# Patient Record
Sex: Male | Born: 1940 | Race: White | Hispanic: No | Marital: Married | Smoking: Former smoker
Health system: Southern US, Community
[De-identification: ages and names within clinical notes are randomized; demographics above are authoritative.]

## PROBLEM LIST (undated history)

## (undated) DIAGNOSIS — I5022 Chronic systolic (congestive) heart failure: Secondary | ICD-10-CM

## (undated) DIAGNOSIS — I739 Peripheral vascular disease, unspecified: Secondary | ICD-10-CM

## (undated) DIAGNOSIS — E119 Type 2 diabetes mellitus without complications: Secondary | ICD-10-CM

## (undated) DIAGNOSIS — I10 Essential (primary) hypertension: Secondary | ICD-10-CM

## (undated) DIAGNOSIS — I2510 Atherosclerotic heart disease of native coronary artery without angina pectoris: Secondary | ICD-10-CM

## (undated) DIAGNOSIS — E785 Hyperlipidemia, unspecified: Secondary | ICD-10-CM

## (undated) DIAGNOSIS — E669 Obesity, unspecified: Secondary | ICD-10-CM

## (undated) HISTORY — DX: Peripheral vascular disease, unspecified: I73.9

## (undated) HISTORY — PX: KNEE SURGERY: SHX244

## (undated) HISTORY — DX: Obesity, unspecified: E66.9

## (undated) HISTORY — DX: Hyperlipidemia, unspecified: E78.5

## (undated) HISTORY — DX: Atherosclerotic heart disease of native coronary artery without angina pectoris: I25.10

## (undated) HISTORY — PX: CORONARY ANGIOPLASTY WITH STENT PLACEMENT: SHX49

---

## 2015-02-10 DIAGNOSIS — M109 Gout, unspecified: Secondary | ICD-10-CM | POA: Insufficient documentation

## 2015-02-10 DIAGNOSIS — M1990 Unspecified osteoarthritis, unspecified site: Secondary | ICD-10-CM | POA: Insufficient documentation

## 2015-02-10 DIAGNOSIS — N182 Chronic kidney disease, stage 2 (mild): Secondary | ICD-10-CM | POA: Insufficient documentation

## 2015-02-10 DIAGNOSIS — K219 Gastro-esophageal reflux disease without esophagitis: Secondary | ICD-10-CM | POA: Insufficient documentation

## 2015-02-10 DIAGNOSIS — J449 Chronic obstructive pulmonary disease, unspecified: Secondary | ICD-10-CM | POA: Insufficient documentation

## 2015-02-10 DIAGNOSIS — N184 Chronic kidney disease, stage 4 (severe): Secondary | ICD-10-CM | POA: Insufficient documentation

## 2016-04-11 DIAGNOSIS — M1711 Unilateral primary osteoarthritis, right knee: Secondary | ICD-10-CM | POA: Insufficient documentation

## 2019-07-04 ENCOUNTER — Telehealth: Payer: Self-pay | Admitting: Orthopaedic Surgery

## 2019-07-04 NOTE — Telephone Encounter (Signed)
Patient's son Oscar Kim) called advised his father's doctor is faxing a release form for Dr Ninfa Linden to complete and Oscar Kim said he will pick up the release form. The form will be coming from [place] and his doctor's name is Dr. Neldon Labella? The number to contact Oscar Kim is [phone] Note: Oscar Kim asked for a call back when release form is completed. Patient is scheduled to see Dr Ninfa Linden 08/19/2019.

## 2019-07-04 NOTE — Telephone Encounter (Signed)
Maybe for you?

## 2019-07-07 NOTE — Telephone Encounter (Signed)
IC, advised fax is here and at front desk to pick up

## 2019-07-23 ENCOUNTER — Other Ambulatory Visit: Payer: Self-pay

## 2019-07-23 ENCOUNTER — Ambulatory Visit (INDEPENDENT_AMBULATORY_CARE_PROVIDER_SITE_OTHER): Payer: Medicare Other | Admitting: Cardiology

## 2019-07-23 ENCOUNTER — Encounter: Payer: Self-pay | Admitting: Cardiology

## 2019-07-23 ENCOUNTER — Ambulatory Visit (INDEPENDENT_AMBULATORY_CARE_PROVIDER_SITE_OTHER): Payer: Medicare Other

## 2019-07-23 VITALS — BP 180/78 | HR 42 | Ht 65.0 in | Wt 212.0 lb

## 2019-07-23 DIAGNOSIS — I509 Heart failure, unspecified: Secondary | ICD-10-CM

## 2019-07-23 DIAGNOSIS — R0600 Dyspnea, unspecified: Secondary | ICD-10-CM

## 2019-07-23 DIAGNOSIS — I5022 Chronic systolic (congestive) heart failure: Secondary | ICD-10-CM

## 2019-07-23 DIAGNOSIS — I493 Ventricular premature depolarization: Secondary | ICD-10-CM

## 2019-07-23 DIAGNOSIS — I2510 Atherosclerotic heart disease of native coronary artery without angina pectoris: Secondary | ICD-10-CM

## 2019-07-23 DIAGNOSIS — E782 Mixed hyperlipidemia: Secondary | ICD-10-CM

## 2019-07-23 DIAGNOSIS — R5383 Other fatigue: Secondary | ICD-10-CM | POA: Insufficient documentation

## 2019-07-23 DIAGNOSIS — R0602 Shortness of breath: Secondary | ICD-10-CM | POA: Diagnosis not present

## 2019-07-23 DIAGNOSIS — I739 Peripheral vascular disease, unspecified: Secondary | ICD-10-CM | POA: Insufficient documentation

## 2019-07-23 DIAGNOSIS — I10 Essential (primary) hypertension: Secondary | ICD-10-CM

## 2019-07-23 HISTORY — DX: Dyspnea, unspecified: R06.00

## 2019-07-23 HISTORY — DX: Mixed hyperlipidemia: E78.2

## 2019-07-23 HISTORY — DX: Atherosclerotic heart disease of native coronary artery without angina pectoris: I25.10

## 2019-07-23 HISTORY — DX: Chronic systolic (congestive) heart failure: I50.22

## 2019-07-23 HISTORY — DX: Other fatigue: R53.83

## 2019-07-23 HISTORY — DX: Ventricular premature depolarization: I49.3

## 2019-07-23 MED ORDER — FUROSEMIDE 40 MG PO TABS: ORAL_TABLET | ORAL | 1 refills | Status: DC

## 2019-07-23 MED ORDER — METOPROLOL SUCCINATE ER 25 MG PO TB24: 12.5000 mg | ORAL_TABLET | Freq: Every day | ORAL | 1 refills | Status: DC

## 2019-07-23 NOTE — Progress Notes (Signed)
Cardiology Office Note:    Date:  07/23/2019   ID:  Oscar Kim, DOB 01-05-1941, MRN [id number]  PCP:  [name], NP  Cardiologist:  Berniece Salines, DO  Electrophysiologist:  None   Referring MD: [name], NP   Chief Complaint  Patient presents with  . Irregular Heart Beat    History of Present Illness:    Oscar Kim is a 79 y.o. 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 The patient tells me that for the last several months that he has been experiencing worsening shortness of breath. He notes that not only did he feel short of breath, he has been experiencing worsening fatigue and lightheadedness. He tells me that back in [place] he has an episode of syncope and has had multiple episodes of presyncope episodes.  Past Medical History:  Diagnosis Date  . CAD (coronary artery disease)   . DM (diabetes mellitus) ([place])   . Hyperlipidemia   . Hypertension   . Obesity (BMI 30-39.9)   . PAD (peripheral artery disease) ([facility])     Past Surgical History:  Procedure Laterality Date  . history of PCI      Current Medications: Current Meds  Medication Sig  . allopurinol (ZYLOPRIM) 100 MG tablet Take 100 mg by mouth 2 (two) times daily.  Marland Kitchen aspirin EC 81 MG tablet Take 81 mg by mouth daily.  . carvedilol (COREG) 3.125 MG tablet Take 3.125 mg by mouth 2 (two) times daily with a meal.  . clopidogrel (PLAVIX) 75 MG tablet Take 75 mg by mouth daily.  Marland Kitchen doxycycline (VIBRAMYCIN) 100 MG capsule Take 100 mg by mouth 2 (two) times daily.  . furosemide (LASIX) 40 MG tablet Take 60 mg (1.5 tablet) in the morning and 40 mg (1 tablet) in the evening daily.  Marland Kitchen gabapentin (NEURONTIN) 300 MG capsule Take 300 mg by mouth 3 (three) times daily.  . insulin NPH-regular Human (70-30) 100 UNIT/ML injection Inject into the skin.  Marland Kitchen  omeprazole (PRILOSEC) 40 MG capsule Take 40 mg by mouth daily.  . traMADol (ULTRAM) 50 MG tablet Take 50 mg by mouth in the morning, at noon, and at bedtime.  . [DISCONTINUED] furosemide (LASIX) 40 MG tablet Take 40 mg by mouth 2 (two) times daily.     Allergies:   Patient has no known allergies.   Social History   Socioeconomic History  . Marital status: Married    Spouse name: Not on file  . Number of children: Not on file  . Years of education: Not on file  . Highest education level: Not on file  Occupational History  . Not on file  Tobacco Use  . Smoking status: Former Smoker    Quit date: 07/23/1979    Years since quitting: 40.0  . Smokeless tobacco: Current User    Types: Snuff  Substance and Sexual Activity  . Alcohol use: Never  . Drug use: Never  . Sexual activity: Not on file  Other Topics Concern  . Not on file  Social History Narrative  . Not on file   Social Determinants of Health   Financial Resource Strain:   . Difficulty of Paying Living Expenses:   Food Insecurity:   . Worried About Charity fundraiser in the Last Year:   . [place] in the Last  Year:   Transportation Needs:   . Film/video editor (Medical):   Marland Kitchen Lack of Transportation (Non-Medical):   Physical Activity:   . Days of Exercise per Week:   . Minutes of Exercise per Session:   Stress:   . Feeling of Stress :   Social Connections:   . Frequency of Communication with Friends and Family:   . Frequency of Social Gatherings with Friends and Family:   . Attends Religious Services:   . Active Member of Clubs or Organizations:   . Attends Archivist Meetings:   Marland Kitchen Marital Status:      Family History: The patient's family history is not on file.  ROS:   Review of Systems  Constitution: Reports fatigue. Negative for decreased appetite, fever and weight gain.  HENT: Negative for congestion, ear discharge, hoarse voice and sore throat.   Eyes: Negative for discharge,  redness, vision loss in right eye and visual halos.  Cardiovascular: Report shortness of breath.  Negative for chest pain, leg swelling, orthopnea and palpitations.  Respiratory: Negative for cough, hemoptysis, shortness of breath and snoring.   Endocrine: Negative for heat intolerance and polyphagia.  Hematologic/Lymphatic: Negative for bleeding problem. Does not bruise/bleed easily.  Skin: Negative for flushing, nail changes, rash and suspicious lesions.  Musculoskeletal: Negative for arthritis, joint pain, muscle cramps, myalgias, neck pain and stiffness.  Gastrointestinal: Negative for abdominal pain, bowel incontinence, diarrhea and excessive appetite.  Genitourinary: Negative for decreased libido, genital sores and incomplete emptying.  Neurological: Negative for brief paralysis, focal weakness, headaches and loss of balance.  Psychiatric/Behavioral: Negative for altered mental status, depression and suicidal ideas.  Allergic/Immunologic: Negative for HIV exposure and persistent infections.    EKGs/Labs/Other Studies Reviewed:    The following studies were reviewed today:   EKG:  The ekg ordered today demonstrates Accelerated junctional rhythm with frequent PVC, HR 88 bpm.   Recent Labs: No results found for requested labs within last 8760 hours.  Recent Lipid Panel No results found for: CHOL, TRIG, HDL, CHOLHDL, VLDL, LDLCALC, LDLDIRECT  Physical Exam:    VS:  BP (!) 180/78 (BP Location: Right Arm, Patient Position: Sitting, Cuff Size: Normal)   Pulse (!) 42   Ht 5\' 5"  (1.651 m)   Wt 212 lb (96.2 kg)   SpO2 98%   BMI 35.28 kg/m     Wt Readings from Last 3 Encounters:  07/23/19 212 lb (96.2 kg)     GEN: obese. Well nourished, well developed in no acute distress HEENT: Normal NECK: No JVD; No carotid bruits LYMPHATICS: No lymphadenopathy CARDIAC: S1S2 noted,RRR, no murmurs, rubs, gallops RESPIRATORY:  Clear to auscultation without rales, wheezing or rhonchi    ABDOMEN: Soft, non-tender, non-distended, +bowel sounds, no guarding. EXTREMITIES: bilateral leg edema 2+, No cyanosis, no clubbing MUSCULOSKELETAL:  No deformity  SKIN: Warm and dry NEUROLOGIC:  Alert and oriented x 3, non-focal PSYCHIATRIC:  Normal affect, good insight  ASSESSMENT:    1. Frequent PVCs   2. Congestive heart failure, unspecified HF chronicity, unspecified heart failure type ([place])   3. Shortness of breath   4. Fatigue, unspecified type   5. Coronary artery disease involving native coronary artery of native heart without angina pectoris   6. PAD (peripheral artery disease) ([place])   7. Mixed hyperlipidemia   8. Essential hypertension    PLAN:     PVC- his ekg shows evidence of frequent pvc at this time- he is on coreg 3.125 mg BID we will continue  this for now.  Today I will place a 3 day zio monitor on him to understand the pvc burden.   Shortness of breath - could be multi factorial. I will change his Lasix dose, he will take 60 mg in the am and 40 mg in the pm. I will reevaluate the need for ischemic evaluation as this could also be an angina equivalent. The patient tells me that this prior cardiologist did some work up, I will get his records prior to pursue further ischemic work up. I will reach out to his prior cardiologist ( Dr. Rayna Sexton [phone]).   Will get blood work today which will include BMP, CBC, TSH, proBNP.  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 Lasix change as described above. Will also get a TTE as part of establishing his care.  CAD - continue DAPT with Aspirin and plavix - unclear when his last stent placement was. Will request records. He reports statin intolerance and does not want to take any statins. HE does have significant history of CAD, I will repeat his Lipid profile and Lpa - he may be a candidate for PCSK9 inhibitor.  DM - managed per pcp.  HTN - he is hypertensive today. I am hoping that the increase in the  lasix will help with this as well.   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 I will get I will repeat his Lipid profile and Lpa - he may be a candidate for PCSK9 inhibitor.  Obesity -the patient understands the need to lose weight with diet and exercise. We have discussed specific strategies for this.  The patient is in agreement with the above plan. The patient left the office in stable condition.  The patient will follow up in 1 month or sooner if needed.   Medication Adjustments/Labs and Tests Ordered: Current medicines are reviewed at length with the patient today.  Concerns regarding medicines are outlined above.  Orders Placed This Encounter  Procedures  . Basic metabolic panel  . Magnesium  . CBC  . TSH  . Pro b natriuretic peptide (BNP)  . LONG TERM MONITOR (3-14 DAYS)  . EKG 12-Lead  . ECHOCARDIOGRAM COMPLETE   Meds ordered this encounter  Medications  . DISCONTD: metoprolol succinate (TOPROL-XL) 25 MG 24 hr tablet    Sig: Take 0.5 tablets (12.5 mg total) by mouth daily.    Dispense:  45 tablet    Refill:  1  . furosemide (LASIX) 40 MG tablet    Sig: Take 60 mg (1.5 tablet) in the morning and 40 mg (1 tablet) in the evening daily.    Dispense:  225 tablet    Refill:  1    Patient Instructions  Medication Instructions:  Your physician has recommended you make the following change in your medication:   START: Metoprolol succinate 12.5 mg daily  INCREASE: Lasix to 60 mg in the morning and 40 mg in the pm daily   *If you need a refill on your cardiac medications before your next appointment, please call your pharmacy*   Lab Work: Your physician recommends that you return for lab work today: bmp, mg, cbc, tsh, bnp If you have labs (blood work) drawn today and your tests are completely normal, you will receive your results only by: Marland Kitchen MyChart Message (if you have MyChart) OR . A paper copy in the mail If you have any lab test that is abnormal or we need to  change your treatment, we will call you to review the results.   Testing/Procedures: Your physician has requested that you have an echocardiogram. Echocardiography is a painless test that uses sound waves to create images of your heart. It provides your doctor with information about the size and shape of your heart and how well your heart's chambers and valves are working. This procedure takes approximately one hour. There are no restrictions for this procedure.  A zio monitor was ordered today. It will remain on for 3 days. You will then return monitor and event diary in provided box. It takes 1-2 weeks for report to be downloaded and returned to [place]. We will call you with the results. If monitor falls off or has orange flashing light, please call Zio for further instructions.     Follow-Up: At [facility], you and your health needs are our priority.  As part of our continuing mission to provide you with exceptional heart care, we have created designated Provider Care Teams.  These Care Teams include your primary Cardiologist (physician) and Advanced Practice Providers (APPs -  Physician Assistants and Nurse Practitioners) who all work together to provide you with the care you need, when you need it.  We recommend signing up for the patient portal called "MyChart".  Sign up information is provided on this After Visit Summary.  MyChart is used to connect with patients for Virtual Visits (Telemedicine).  Patients are able to view lab/test results, encounter notes, upcoming appointments, etc.  Non-urgent messages can be sent to your provider as well.   To learn more about what you can do with MyChart, go to [email or web address].    Your next appointment:   1 month(s)  The format for your next appointment:   In Person  Provider:   Berniece Salines, DO   Other Instructions   Metoprolol Extended-Release Tablets What is this medicine? METOPROLOL (me TOE proe lole) is a beta blocker. It  decreases the amount of work your heart has to do and helps your heart beat regularly. It treats high blood pressure and/or prevent chest pain (also called angina). It also treats heart failure. This medicine may be used for other purposes; ask your health care provider or pharmacist if you have questions. COMMON BRAND NAME(S): toprol, Toprol XL What should I tell my health care provider before I take this medicine? They need to know if you have any of these conditions:  diabetes  heart or vessel disease like slow heart rate, worsening heart failure, heart block, sick sinus syndrome or Raynaud's disease  kidney disease  liver disease  lung or breathing disease, like asthma or emphysema  pheochromocytoma  thyroid disease  an unusual or allergic reaction to metoprolol, other beta-blockers, medicines, foods, dyes, or preservatives  pregnant or trying to get pregnant  breast-feeding How should I use this medicine? Take this drug by mouth. Take it as directed on the prescription label at the same time every day. Take it with food. You may cut the tablet in half if it is scored (has a line in the middle of it). This may help you swallow the tablet if the whole tablet is too big. Be sure to take both halves. Do not take just one-half of the tablet. Keep taking it unless your health care provider tells you to stop. Talk to your health care provider about the use of this drug in children. While it may be prescribed for children as young as 6 for selected conditions, precautions do  apply. Overdosage: If you think you have taken too much of this medicine contact a poison control center or emergency room at once. NOTE: This medicine is only for you. Do not share this medicine with others. What if I miss a dose? If you miss a dose, take it as soon as you can. If it is almost time for your next dose, take only that dose. Do not take double or extra doses. What may interact with this medicine? This  medicine may interact with the following medications:  certain medicines for blood pressure, heart disease, irregular heart beat  certain medicines for depression, like monoamine oxidase (MAO) inhibitors, fluoxetine, or paroxetine  clonidine  dobutamine  epinephrine  isoproterenol  reserpine This list may not describe all possible interactions. Give your health care provider a list of all the medicines, herbs, non-prescription drugs, or dietary supplements you use. Also tell them if you smoke, drink alcohol, or use illegal drugs. Some items may interact with your medicine. What should I watch for while using this medicine? Visit your doctor or health care professional for regular check ups. Contact your doctor right away if your symptoms worsen. Check your blood pressure and pulse rate regularly. Ask your health care professional what your blood pressure and pulse rate should be, and when you should contact them. You may get drowsy or dizzy. Do not drive, use machinery, or do anything that needs mental alertness until you know how this medicine affects you. Do not sit or stand up quickly, especially if you are an older patient. This reduces the risk of dizzy or fainting spells. Contact your doctor if these symptoms continue. Alcohol may interfere with the effect of this medicine. Avoid alcoholic drinks. This medicine may increase blood sugar. Ask your healthcare provider if changes in diet or medicines are needed if you have diabetes. What side effects may I notice from receiving this medicine? Side effects that you should report to your doctor or health care professional as soon as possible:  allergic reactions like skin rash, itching or hives  cold or numb hands or feet  depression  difficulty breathing  faint  fever with sore throat  irregular heartbeat, chest pain  rapid weight gain   signs and symptoms of high blood sugar such as being more thirsty or hungry or having to  urinate more than normal. You may also feel very tired or have blurry vision.  swollen legs or ankles Side effects that usually do not require medical attention (report to your doctor or health care professional if they continue or are bothersome):  anxiety or nervousness  change in sex drive or performance  dry skin  headache  nightmares or trouble sleeping  short term memory loss  stomach upset or diarrhea This list may not describe all possible side effects. Call your doctor for medical advice about side effects. You may report side effects to FDA at 1-800-FDA-1088. Where should I keep my medicine? Keep out of the reach of children and pets. Store at room temperature between 20 and 25 degrees C (68 and 77 degrees F). Throw away any unused drug after the expiration date. NOTE: This sheet is a summary. It may not cover all possible information. If you have questions about this medicine, talk to your doctor, pharmacist, or health care provider.  2020 Elsevier/Gold Standard (2018-12-05 18:23:00)   Echocardiogram An echocardiogram is a procedure that uses painless sound waves (ultrasound) to produce an image of the heart. Images from an echocardiogram  can provide important information about:  Signs of coronary artery disease (CAD).  Aneurysm detection. An aneurysm is a weak or damaged part of an artery wall that bulges out from the normal force of blood pumping through the body.  Heart size and shape. Changes in the size or shape of the heart can be associated with certain conditions, including heart failure, aneurysm, and CAD.  Heart muscle function.  Heart valve function.  Signs of a past heart attack.  Fluid buildup around the heart.  Thickening of the heart muscle.  A tumor or infectious growth around the heart valves. Tell a health care provider about:  Any allergies you have.  All medicines you are taking, including vitamins, herbs, eye drops, creams, and  over-the-counter medicines.  Any blood disorders you have.  Any surgeries you have had.  Any medical conditions you have.  Whether you are pregnant or may be pregnant. What are the risks? Generally, this is a safe procedure. However, problems may occur, including:  Allergic reaction to dye (contrast) that may be used during the procedure. What happens before the procedure? No specific preparation is needed. You may eat and drink normally. What happens during the procedure?   An IV tube may be inserted into one of your veins.  You may receive contrast through this tube. A contrast is an injection that improves the quality of the pictures from your heart.  A gel will be applied to your chest.  A wand-like tool (transducer) will be moved over your chest. The gel will help to transmit the sound waves from the transducer.  The sound waves will harmlessly bounce off of your heart to allow the heart images to be captured in real-time motion. The images will be recorded on a computer. The procedure may vary among health care providers and hospitals. What happens after the procedure?  You may return to your normal, everyday life, including diet, activities, and medicines, unless your health care provider tells you not to do that. Summary  An echocardiogram is a procedure that uses painless sound waves (ultrasound) to produce an image of the heart.  Images from an echocardiogram can provide important information about the size and shape of your heart, heart muscle function, heart valve function, and fluid buildup around your heart.  You do not need to do anything to prepare before this procedure. You may eat and drink normally.  After the echocardiogram is completed, you may return to your normal, everyday life, unless your health care provider tells you not to do that. This information is not intended to replace advice given to you by your health care provider. Make sure you discuss  any questions you have with your health care provider. Document Revised: 08/15/2018 Document Reviewed: 05/27/2016 Elsevier Patient Education  Box.     Adopting a Healthy Lifestyle.  Know what a healthy weight is for you (roughly BMI <25) and aim to maintain this   Aim for 7+ servings of fruits and vegetables daily   65-80+ fluid ounces of water or unsweet tea for healthy kidneys   Limit to max 1 drink of alcohol per day; avoid smoking/tobacco   Limit animal fats in diet for cholesterol and heart health - choose grass fed whenever available   Avoid highly processed foods, and foods high in saturated/trans fats   Aim for low stress - take time to unwind and care for your mental health   Aim for 150 min of moderate intensity exercise weekly  for heart health, and weights twice weekly for bone health   Aim for 7-9 hours of sleep daily   When it comes to diets, agreement about the perfect plan isnt easy to find, even among the experts. Experts at the [employer] developed an idea known as the Healthy Eating Plate. Just imagine a plate divided into logical, healthy portions.   The emphasis is on diet quality:   Load up on vegetables and fruits - one-half of your plate: Aim for color and variety, and remember that potatoes dont count.   Go for whole grains - one-quarter of your plate: Whole wheat, barley, wheat berries, quinoa, oats, brown rice, and foods made with them. If you want pasta, go with whole wheat pasta.   Protein power - one-quarter of your plate: Fish, chicken, beans, and nuts are all healthy, versatile protein sources. Limit red meat.   The diet, however, does go beyond the plate, offering a few other suggestions.   Use healthy plant oils, such as olive, canola, soy, corn, sunflower and peanut. Check the labels, and avoid partially hydrogenated oil, which have unhealthy trans fats.   If youre thirsty, drink water. Coffee and tea  are good in moderation, but skip sugary drinks and limit milk and dairy products to one or two daily servings.   The type of carbohydrate in the diet is more important than the amount. Some sources of carbohydrates, such as vegetables, fruits, whole grains, and beans-are healthier than others.   Finally, stay active  Signed, Berniece Salines, DO  07/23/2019 6:09 PM    [facility]

## 2019-07-23 NOTE — Patient Instructions (Addendum)
Medication Instructions:  Your physician has recommended you make the following change in your medication:   START: Metoprolol succinate 12.5 mg daily  INCREASE: Lasix to 60 mg in the morning and 40 mg in the pm daily   *If you need a refill on your cardiac medications before your next appointment, please call your pharmacy*   Lab Work: Your physician recommends that you return for lab work today: bmp, mg, cbc, tsh, bnp If you have labs (blood work) drawn today and your tests are completely normal, you will receive your results only by: Marland Kitchen MyChart Message (if you have MyChart) OR . A paper copy in the mail If you have any lab test that is abnormal or we need to change your treatment, we will call you to review the results.   Testing/Procedures: Your physician has requested that you have an echocardiogram. Echocardiography is a painless test that uses sound waves to create images of your heart. It provides your doctor with information about the size and shape of your heart and how well your heart's chambers and valves are working. This procedure takes approximately one hour. There are no restrictions for this procedure.  A zio monitor was ordered today. It will remain on for 3 days. You will then return monitor and event diary in provided box. It takes 1-2 weeks for report to be downloaded and returned to [place]. We will call you with the results. If monitor falls off or has orange flashing light, please call Zio for further instructions.     Follow-Up: At [facility], you and your health needs are our priority.  As part of our continuing mission to provide you with exceptional heart care, we have created designated Provider Care Teams.  These Care Teams include your primary Cardiologist (physician) and Advanced Practice Providers (APPs -  Physician Assistants and Nurse Practitioners) who all work together to provide you with the care you need, when you need it.  We recommend signing up for  the patient portal called "MyChart".  Sign up information is provided on this After Visit Summary.  MyChart is used to connect with patients for Virtual Visits (Telemedicine).  Patients are able to view lab/test results, encounter notes, upcoming appointments, etc.  Non-urgent messages can be sent to your provider as well.   To learn more about what you can do with MyChart, go to [email or web address].    Your next appointment:   1 month(s)  The format for your next appointment:   In Person  Provider:   Berniece Salines, DO   Other Instructions   Metoprolol Extended-Release Tablets What is this medicine? METOPROLOL (me TOE proe lole) is a beta blocker. It decreases the amount of work your heart has to do and helps your heart beat regularly. It treats high blood pressure and/or prevent chest pain (also called angina). It also treats heart failure. This medicine may be used for other purposes; ask your health care provider or pharmacist if you have questions. COMMON BRAND NAME(S): toprol, Toprol XL What should I tell my health care provider before I take this medicine? They need to know if you have any of these conditions:  diabetes  heart or vessel disease like slow heart rate, worsening heart failure, heart block, sick sinus syndrome or Raynaud's disease  kidney disease  liver disease  lung or breathing disease, like asthma or emphysema  pheochromocytoma  thyroid disease  an unusual or allergic reaction to metoprolol, other beta-blockers, medicines, foods, dyes, or preservatives  pregnant or trying to get pregnant  breast-feeding How should I use this medicine? Take this drug by mouth. Take it as directed on the prescription label at the same time every day. Take it with food. You may cut the tablet in half if it is scored (has a line in the middle of it). This may help you swallow the tablet if the whole tablet is too big. Be sure to take both halves. Do not take just  one-half of the tablet. Keep taking it unless your health care provider tells you to stop. Talk to your health care provider about the use of this drug in children. While it may be prescribed for children as young as 6 for selected conditions, precautions do apply. Overdosage: If you think you have taken too much of this medicine contact a poison control center or emergency room at once. NOTE: This medicine is only for you. Do not share this medicine with others. What if I miss a dose? If you miss a dose, take it as soon as you can. If it is almost time for your next dose, take only that dose. Do not take double or extra doses. What may interact with this medicine? This medicine may interact with the following medications:  certain medicines for blood pressure, heart disease, irregular heart beat  certain medicines for depression, like monoamine oxidase (MAO) inhibitors, fluoxetine, or paroxetine  clonidine  dobutamine  epinephrine  isoproterenol  reserpine This list may not describe all possible interactions. Give your health care provider a list of all the medicines, herbs, non-prescription drugs, or dietary supplements you use. Also tell them if you smoke, drink alcohol, or use illegal drugs. Some items may interact with your medicine. What should I watch for while using this medicine? Visit your doctor or health care professional for regular check ups. Contact your doctor right away if your symptoms worsen. Check your blood pressure and pulse rate regularly. Ask your health care professional what your blood pressure and pulse rate should be, and when you should contact them. You may get drowsy or dizzy. Do not drive, use machinery, or do anything that needs mental alertness until you know how this medicine affects you. Do not sit or stand up quickly, especially if you are an older patient. This reduces the risk of dizzy or fainting spells. Contact your doctor if these symptoms continue.  Alcohol may interfere with the effect of this medicine. Avoid alcoholic drinks. This medicine may increase blood sugar. Ask your healthcare provider if changes in diet or medicines are needed if you have diabetes. What side effects may I notice from receiving this medicine? Side effects that you should report to your doctor or health care professional as soon as possible:  allergic reactions like skin rash, itching or hives  cold or numb hands or feet  depression  difficulty breathing  faint  fever with sore throat  irregular heartbeat, chest pain  rapid weight gain   signs and symptoms of high blood sugar such as being more thirsty or hungry or having to urinate more than normal. You may also feel very tired or have blurry vision.  swollen legs or ankles Side effects that usually do not require medical attention (report to your doctor or health care professional if they continue or are bothersome):  anxiety or nervousness  change in sex drive or performance  dry skin  headache  nightmares or trouble sleeping  short term memory loss  stomach upset or diarrhea  This list may not describe all possible side effects. Call your doctor for medical advice about side effects. You may report side effects to FDA at 1-800-FDA-1088. Where should I keep my medicine? Keep out of the reach of children and pets. Store at room temperature between 20 and 25 degrees C (68 and 77 degrees F). Throw away any unused drug after the expiration date. NOTE: This sheet is a summary. It may not cover all possible information. If you have questions about this medicine, talk to your doctor, pharmacist, or health care provider.  2020 Elsevier/Gold Standard (2018-12-05 18:23:00)   Echocardiogram An echocardiogram is a procedure that uses painless sound waves (ultrasound) to produce an image of the heart. Images from an echocardiogram can provide important information about:  Signs of coronary  artery disease (CAD).  Aneurysm detection. An aneurysm is a weak or damaged part of an artery wall that bulges out from the normal force of blood pumping through the body.  Heart size and shape. Changes in the size or shape of the heart can be associated with certain conditions, including heart failure, aneurysm, and CAD.  Heart muscle function.  Heart valve function.  Signs of a past heart attack.  Fluid buildup around the heart.  Thickening of the heart muscle.  A tumor or infectious growth around the heart valves. Tell a health care provider about:  Any allergies you have.  All medicines you are taking, including vitamins, herbs, eye drops, creams, and over-the-counter medicines.  Any blood disorders you have.  Any surgeries you have had.  Any medical conditions you have.  Whether you are pregnant or may be pregnant. What are the risks? Generally, this is a safe procedure. However, problems may occur, including:  Allergic reaction to dye (contrast) that may be used during the procedure. What happens before the procedure? No specific preparation is needed. You may eat and drink normally. What happens during the procedure?   An IV tube may be inserted into one of your veins.  You may receive contrast through this tube. A contrast is an injection that improves the quality of the pictures from your heart.  A gel will be applied to your chest.  A wand-like tool (transducer) will be moved over your chest. The gel will help to transmit the sound waves from the transducer.  The sound waves will harmlessly bounce off of your heart to allow the heart images to be captured in real-time motion. The images will be recorded on a computer. The procedure may vary among health care providers and hospitals. What happens after the procedure?  You may return to your normal, everyday life, including diet, activities, and medicines, unless your health care provider tells you not to do  that. Summary  An echocardiogram is a procedure that uses painless sound waves (ultrasound) to produce an image of the heart.  Images from an echocardiogram can provide important information about the size and shape of your heart, heart muscle function, heart valve function, and fluid buildup around your heart.  You do not need to do anything to prepare before this procedure. You may eat and drink normally.  After the echocardiogram is completed, you may return to your normal, everyday life, unless your health care provider tells you not to do that. This information is not intended to replace advice given to you by your health care provider. Make sure you discuss any questions you have with your health care provider. Document Revised: 08/15/2018 Document Reviewed: 05/27/2016 Elsevier  Patient Education  [employer].

## 2019-07-24 LAB — BASIC METABOLIC PANEL
BUN/Creatinine Ratio: 10 (ref 10–24)
BUN: 12 mg/dL (ref 8–27)
CO2: 28 mmol/L (ref 20–29)
Calcium: 8.5 mg/dL — ABNORMAL LOW (ref 8.6–10.2)
Chloride: 98 mmol/L (ref 96–106)
Creatinine, Ser: 1.22 mg/dL (ref 0.76–1.27)
GFR calc Af Amer: 66 mL/min/{1.73_m2} (ref >59)
GFR calc non Af Amer: 57 mL/min/{1.73_m2} — ABNORMAL LOW (ref >59)
Glucose: 230 mg/dL — ABNORMAL HIGH (ref 65–99)
Potassium: 3.4 mmol/L — ABNORMAL LOW (ref 3.5–5.2)
Sodium: 139 mmol/L (ref 134–144)

## 2019-07-24 LAB — CBC
Hematocrit: 34.1 % — ABNORMAL LOW (ref 37.5–51.0)
Hemoglobin: 10.7 g/dL — ABNORMAL LOW (ref 13.0–17.7)
MCH: 26.4 pg — ABNORMAL LOW (ref 26.6–33.0)
MCHC: 31.4 g/dL — ABNORMAL LOW (ref 31.5–35.7)
MCV: 84 fL (ref 79–97)
Platelets: 222 10*3/uL (ref 150–450)
RBC: 4.05 x10E6/uL — ABNORMAL LOW (ref 4.14–5.80)
RDW: 17 % — ABNORMAL HIGH (ref 11.6–15.4)
WBC: 9.2 10*3/uL (ref 3.4–10.8)

## 2019-07-24 LAB — PRO B NATRIURETIC PEPTIDE: NT-Pro BNP: 1962 pg/mL — ABNORMAL HIGH (ref 0–486)

## 2019-07-24 LAB — TSH: TSH: 2.27 u[IU]/mL (ref 0.450–4.500)

## 2019-07-24 LAB — MAGNESIUM: Magnesium: 1.8 mg/dL (ref 1.6–2.3)

## 2019-07-24 MED ORDER — METOLAZONE 5 MG PO TABS: 5.0000 mg | ORAL_TABLET | Freq: Every day | ORAL | 6 refills | Status: DC

## 2019-07-24 MED ORDER — POTASSIUM CHLORIDE CRYS ER 20 MEQ PO TBCR: 20.0000 meq | EXTENDED_RELEASE_TABLET | Freq: Two times a day (BID) | ORAL | 6 refills | Status: DC

## 2019-07-24 NOTE — Addendum Note (Signed)
Addended by: Truddie Hidden on: 07/24/2019 08:51 AM   Modules accepted: Orders

## 2019-07-29 ENCOUNTER — Other Ambulatory Visit: Payer: Self-pay

## 2019-07-29 ENCOUNTER — Inpatient Hospital Stay (HOSPITAL_COMMUNITY)
Admission: EM | Admit: 2019-07-29 | Discharge: 2019-07-31 | DRG: 291 | Disposition: A | Discharge status: Home Health | Payer: Medicare Other | Attending: Cardiology | Admitting: Cardiology

## 2019-07-29 ENCOUNTER — Emergency Department (HOSPITAL_COMMUNITY): Payer: Medicare Other

## 2019-07-29 ENCOUNTER — Telehealth: Payer: Self-pay | Admitting: Cardiology

## 2019-07-29 ENCOUNTER — Encounter (HOSPITAL_COMMUNITY): Payer: Self-pay | Admitting: Physician Assistant

## 2019-07-29 DIAGNOSIS — R0602 Shortness of breath: Secondary | ICD-10-CM | POA: Diagnosis not present

## 2019-07-29 DIAGNOSIS — E782 Mixed hyperlipidemia: Secondary | ICD-10-CM | POA: Diagnosis present

## 2019-07-29 DIAGNOSIS — I2510 Atherosclerotic heart disease of native coronary artery without angina pectoris: Secondary | ICD-10-CM | POA: Diagnosis present

## 2019-07-29 DIAGNOSIS — Z87891 Personal history of nicotine dependence: Secondary | ICD-10-CM | POA: Diagnosis not present

## 2019-07-29 DIAGNOSIS — Z8249 Family history of ischemic heart disease and other diseases of the circulatory system: Secondary | ICD-10-CM | POA: Diagnosis not present

## 2019-07-29 DIAGNOSIS — Z955 Presence of coronary angioplasty implant and graft: Secondary | ICD-10-CM

## 2019-07-29 DIAGNOSIS — E1151 Type 2 diabetes mellitus with diabetic peripheral angiopathy without gangrene: Secondary | ICD-10-CM | POA: Diagnosis present

## 2019-07-29 DIAGNOSIS — I130 Hypertensive heart and chronic kidney disease with heart failure and stage 1 through stage 4 chronic kidney disease, or unspecified chronic kidney disease: Secondary | ICD-10-CM | POA: Diagnosis present

## 2019-07-29 DIAGNOSIS — I10 Essential (primary) hypertension: Secondary | ICD-10-CM | POA: Diagnosis present

## 2019-07-29 DIAGNOSIS — D631 Anemia in chronic kidney disease: Secondary | ICD-10-CM | POA: Diagnosis present

## 2019-07-29 DIAGNOSIS — I509 Heart failure, unspecified: Secondary | ICD-10-CM

## 2019-07-29 DIAGNOSIS — Z794 Long term (current) use of insulin: Secondary | ICD-10-CM

## 2019-07-29 DIAGNOSIS — Z7902 Long term (current) use of antithrombotics/antiplatelets: Secondary | ICD-10-CM

## 2019-07-29 DIAGNOSIS — N1831 Chronic kidney disease, stage 3a: Secondary | ICD-10-CM | POA: Diagnosis present

## 2019-07-29 DIAGNOSIS — I5023 Acute on chronic systolic (congestive) heart failure: Secondary | ICD-10-CM | POA: Diagnosis present

## 2019-07-29 DIAGNOSIS — Z20822 Contact with and (suspected) exposure to covid-19: Secondary | ICD-10-CM | POA: Diagnosis present

## 2019-07-29 DIAGNOSIS — N179 Acute kidney failure, unspecified: Secondary | ICD-10-CM | POA: Diagnosis present

## 2019-07-29 DIAGNOSIS — Z7982 Long term (current) use of aspirin: Secondary | ICD-10-CM

## 2019-07-29 DIAGNOSIS — R0600 Dyspnea, unspecified: Secondary | ICD-10-CM

## 2019-07-29 DIAGNOSIS — E1122 Type 2 diabetes mellitus with diabetic chronic kidney disease: Secondary | ICD-10-CM | POA: Diagnosis present

## 2019-07-29 DIAGNOSIS — I340 Nonrheumatic mitral (valve) insufficiency: Secondary | ICD-10-CM | POA: Diagnosis not present

## 2019-07-29 DIAGNOSIS — E119 Type 2 diabetes mellitus without complications: Secondary | ICD-10-CM

## 2019-07-29 DIAGNOSIS — I5022 Chronic systolic (congestive) heart failure: Secondary | ICD-10-CM | POA: Diagnosis present

## 2019-07-29 DIAGNOSIS — I493 Ventricular premature depolarization: Secondary | ICD-10-CM | POA: Diagnosis present

## 2019-07-29 DIAGNOSIS — G4700 Insomnia, unspecified: Secondary | ICD-10-CM | POA: Diagnosis present

## 2019-07-29 HISTORY — DX: Type 2 diabetes mellitus without complications: E11.9

## 2019-07-29 HISTORY — DX: Chronic systolic (congestive) heart failure: I50.22

## 2019-07-29 HISTORY — DX: Essential (primary) hypertension: I10

## 2019-07-29 LAB — CBC
HCT: 35.7 % — ABNORMAL LOW (ref 39.0–52.0)
Hemoglobin: 11.5 g/dL — ABNORMAL LOW (ref 13.0–17.0)
MCH: 26.3 pg (ref 26.0–34.0)
MCHC: 32.2 g/dL (ref 30.0–36.0)
MCV: 81.5 fL (ref 80.0–100.0)
Platelets: 295 10*3/uL (ref 150–400)
RBC: 4.38 MIL/uL (ref 4.22–5.81)
RDW: 16.5 % — ABNORMAL HIGH (ref 11.5–15.5)
WBC: 11.3 10*3/uL — ABNORMAL HIGH (ref 4.0–10.5)
nRBC: 0 % (ref 0.0–0.2)

## 2019-07-29 LAB — BASIC METABOLIC PANEL
Anion gap: 14 (ref 5–15)
BUN: 31 mg/dL — ABNORMAL HIGH (ref 8–23)
CO2: 31 mmol/L (ref 22–32)
Calcium: 9.1 mg/dL (ref 8.9–10.3)
Chloride: 88 mmol/L — ABNORMAL LOW (ref 98–111)
Creatinine, Ser: 1.7 mg/dL — ABNORMAL HIGH (ref 0.61–1.24)
GFR calc Af Amer: 44 mL/min — ABNORMAL LOW (ref >60)
GFR calc non Af Amer: 38 mL/min — ABNORMAL LOW (ref >60)
Glucose, Bld: 240 mg/dL — ABNORMAL HIGH (ref 70–99)
Potassium: 3.5 mmol/L (ref 3.5–5.1)
Sodium: 133 mmol/L — ABNORMAL LOW (ref 135–145)

## 2019-07-29 LAB — BRAIN NATRIURETIC PEPTIDE: B Natriuretic Peptide: 65 pg/mL (ref 0.0–100.0)

## 2019-07-29 MED ORDER — CLOPIDOGREL BISULFATE 75 MG PO TABS
75.0000 mg | ORAL_TABLET | Freq: Every day | ORAL | Status: DC
  Administered 2019-07-30 – 2019-07-31 (×2): 75 mg via ORAL
  Filled 2019-07-29 (×3): qty 1

## 2019-07-29 MED ORDER — TRAMADOL HCL 50 MG PO TABS
50.0000 mg | ORAL_TABLET | Freq: Four times a day (QID) | ORAL | Status: DC | PRN
  Administered 2019-07-29 – 2019-07-30 (×2): 50 mg via ORAL
  Filled 2019-07-29 (×2): qty 1

## 2019-07-29 MED ORDER — ASPIRIN EC 81 MG PO TBEC
81.0000 mg | DELAYED_RELEASE_TABLET | Freq: Every day | ORAL | Status: DC
  Administered 2019-07-29 – 2019-07-31 (×3): 81 mg via ORAL
  Filled 2019-07-29 (×3): qty 1

## 2019-07-29 MED ORDER — POTASSIUM CHLORIDE CRYS ER 20 MEQ PO TBCR
20.0000 meq | EXTENDED_RELEASE_TABLET | Freq: Two times a day (BID) | ORAL | Status: DC
  Administered 2019-07-29 – 2019-07-31 (×4): 20 meq via ORAL
  Filled 2019-07-29 (×4): qty 1

## 2019-07-29 MED ORDER — PANTOPRAZOLE SODIUM 40 MG PO TBEC
40.0000 mg | DELAYED_RELEASE_TABLET | Freq: Every day | ORAL | Status: DC
  Administered 2019-07-29 – 2019-07-31 (×3): 40 mg via ORAL
  Filled 2019-07-29 (×3): qty 1

## 2019-07-29 MED ORDER — CARVEDILOL 3.125 MG PO TABS
3.1250 mg | ORAL_TABLET | Freq: Two times a day (BID) | ORAL | Status: DC
  Administered 2019-07-30 – 2019-07-31 (×3): 3.125 mg via ORAL
  Filled 2019-07-29 (×4): qty 1

## 2019-07-29 MED ORDER — ALLOPURINOL 100 MG PO TABS
100.0000 mg | ORAL_TABLET | Freq: Two times a day (BID) | ORAL | Status: DC
  Administered 2019-07-30 – 2019-07-31 (×3): 100 mg via ORAL
  Filled 2019-07-29 (×3): qty 1

## 2019-07-29 MED ORDER — SODIUM CHLORIDE 0.9% FLUSH: 3.0000 mL | Freq: Once | INTRAVENOUS | Status: DC

## 2019-07-29 MED ORDER — MAGNESIUM OXIDE 400 (241.3 MG) MG PO TABS
400.0000 mg | ORAL_TABLET | Freq: Once | ORAL | Status: AC
  Administered 2019-07-29: 22:00:00 400 mg via ORAL
  Filled 2019-07-29: qty 1

## 2019-07-29 MED ORDER — GABAPENTIN 300 MG PO CAPS
300.0000 mg | ORAL_CAPSULE | Freq: Three times a day (TID) | ORAL | Status: DC
  Administered 2019-07-29 – 2019-07-31 (×5): 300 mg via ORAL
  Filled 2019-07-29 (×3): qty 1
  Filled 2019-07-29: qty 3
  Filled 2019-07-29: qty 1

## 2019-07-29 MED ORDER — ZOLPIDEM TARTRATE 5 MG PO TABS: 5.0000 mg | ORAL_TABLET | Freq: Every evening | ORAL | 0 refills | Status: DC | PRN

## 2019-07-29 NOTE — ED Notes (Signed)
Son, Vicente Serene, to be called when pt in room at [phone].

## 2019-07-29 NOTE — Telephone Encounter (Signed)
Follow Up:      Returning Oscar Kim 's call  from today.

## 2019-07-29 NOTE — ED Notes (Signed)
Pt advising he is uncomfortable and needs a sleep aid. Pt comfort measures initiated, no relief. Pt advises of restless legs. MD paged.

## 2019-07-29 NOTE — Telephone Encounter (Signed)
Returned call to Pt's son.  Per son Pt is not doing well.  He cannot sleep because he has trouble breathing laying down even on multiple pillows.  Per Pt he is only urinating a little more with the addition of the metolazone.    Family is concerned that he needs to be seen again.    Will forward to Dr. Harriet Masson for further advisement.  Pt appears to be quite complicated.

## 2019-07-29 NOTE — Telephone Encounter (Signed)
Left message for patient to call back  

## 2019-07-29 NOTE — Telephone Encounter (Signed)
Okay thank you

## 2019-07-29 NOTE — Telephone Encounter (Signed)
New message   Patient's son states that the patient has questions about metolazone (ZAROXOLYN) 5 MG tablet. Please call to discuss.

## 2019-07-29 NOTE — ED Triage Notes (Signed)
Pt sent here by PCP in [place] for IV diuresis-BNP over 1900 in the office 6 days ago. Endorses intermittent shob. NAD.

## 2019-07-29 NOTE — Telephone Encounter (Signed)
Please let the patient and his family know that he needs to go immediately to be emergency department to be seen he will need to be treated with IV diuretics

## 2019-07-29 NOTE — H&P (Addendum)
Cardiology Admission History and Physical:   Patient ID: Oscar Kim; MRN: [id number]; DOB: 1940/08/22   Admission date: 07/29/2019  Primary Care Provider: [name], NP Primary Cardiologist: Berniece Salines, DO 07/23/2019 Previously, Dr. Rebeca Allegra in [place], phone number [phone] Primary Electrophysiologist: None    Chief Complaint: Shortness of breath  Patient Profile:   Oscar Kim is a 79 y.o. 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   History of Present Illness:   [name] was seen by Dr. Harriet Masson on 07/23/2019.  He was volume overloaded by exam. His Lasix was increased.  There was also concern for PVCs and he was to get a 3-day ZIO monitor to evaluate this.  Dr. Harriet Masson was also going to contact his prior cardiologist for records.  Today, the family was calling about taking metolazone.  The family was reporting that the patient was having trouble sleeping because of shortness of breath.  He did not seem to be responding that well to the metolazone.  His BNP was noted to have been 2000.  They were requested to bring [name] to the emergency room.  I spoke with [name] and his son in the emergency room.  [name] has not had any chest pain.  He has been urinating extra on the higher dose of Lasix, and thinks that he is breathing better.  He thinks he does okay walking on flat ground.  However, he ambulates poorly because of musculoskeletal issues and does not walk very far or very fast.  He has been weighing himself, and his weight has been about 200 pounds.  He does not think it has changed very much, but he is only been weighing himself for 3 days.  He reports orthopnea and describes PND as well.  The PND does not occur every night, but occurs regularly.  He thinks it may have improved some with the extra Lasix  doses.  He has been dealing with the orthopnea for a while, but feels that some of it is just insomnia.  He may be minimizing.  He is not short of breath at rest.  He has some lower extremity edema, but it has improved.   Past Medical History:  Diagnosis Date  . CAD (coronary artery disease)    Reported infarct and subsequent stent intervention approximately 15 years ago - [place]  . Chronic systolic heart failure ([place])   . Essential hypertension   . Hyperlipidemia   . Obesity (BMI 30-39.9)   . PAD (peripheral artery disease) ([facility])    Previous stent revascularization, left leg - [place]  . Type 2 diabetes mellitus ([place])     Past Surgical History:  Procedure Laterality Date  . CORONARY ANGIOPLASTY WITH STENT PLACEMENT     When he lived in [place], multiple stents placed  . KNEE SURGERY Right      Medications Prior to Admission: Prior to Admission medications   Medication Sig Start Date End Date Taking? Authorizing Provider  allopurinol (ZYLOPRIM) 100 MG tablet Take 100 mg by mouth 2 (two) times daily.    [provider]  aspirin EC 81 MG tablet Take 81 mg by mouth daily.    [provider]  carvedilol (COREG) 3.125 MG tablet Take 3.125 mg by mouth 2 (two) times daily with a meal.    [provider]  clopidogrel (  PLAVIX) 75 MG tablet Take 75 mg by mouth daily.    [provider]  doxycycline (VIBRAMYCIN) 100 MG capsule Take 100 mg by mouth 2 (two) times daily.    [provider]  furosemide (LASIX) 40 MG tablet Take 60 mg (1.5 tablet) in the morning and 40 mg (1 tablet) in the evening daily. 07/23/19   Tobb, Kardie, DO  gabapentin (NEURONTIN) 300 MG capsule Take 300 mg by mouth 3 (three) times daily.    [provider]  insulin NPH-regular Human (70-30) 100 UNIT/ML injection Inject into the skin.    [provider]  metolazone (ZAROXOLYN) 5 MG tablet Take 1 tablet (5 mg total) by mouth daily. Take 30 minutes  before your first dose of Lasix. 07/24/19 10/22/19  Tobb, Kardie, DO  omeprazole (PRILOSEC) 40 MG capsule Take 40 mg by mouth daily.    [provider]  potassium chloride SA (KLOR-CON) 20 MEQ tablet Take 1 tablet (20 mEq total) by mouth 2 (two) times daily. 07/24/19   Tobb, Kardie, DO  traMADol (ULTRAM) 50 MG tablet Take 50 mg by mouth in the morning, at noon, and at bedtime.    [provider]     Allergies:   No Known Allergies  Social History:   Social History   Socioeconomic History  . Marital status: Married    Spouse name: Not on file  . Number of children: Not on file  . Years of education: Not on file  . Highest education level: Not on file  Occupational History  . Not on file  Tobacco Use  . Smoking status: Former Smoker    Types: Cigarettes    Quit date: 07/23/1979    Years since quitting: 40.0  . Smokeless tobacco: Current User    Types: Snuff  Substance and Sexual Activity  . Alcohol use: Never  . Drug use: Never  . Sexual activity: Not on file  Other Topics Concern  . Not on file  Social History Narrative  . Not on file   Social Determinants of Health   Financial Resource Strain:   . Difficulty of Paying Living Expenses:   Food Insecurity:   . Worried About Charity fundraiser in the Last Year:   . Arboriculturist in the Last Year:   Transportation Needs:   . Film/video editor (Medical):   Marland Kitchen Lack of Transportation (Non-Medical):   Physical Activity:   . Days of Exercise per Week:   . Minutes of Exercise per Session:   Stress:   . Feeling of Stress :   Social Connections:   . Frequency of Communication with Friends and Family:   . Frequency of Social Gatherings with Friends and Family:   . Attends Religious Services:   . Active Member of Clubs or Organizations:   . Attends Archivist Meetings:   Marland Kitchen Marital Status:   Intimate Partner Violence:   . Fear of Current or Ex-Partner:   . Emotionally Abused:   Marland Kitchen Physically  Abused:   . Sexually Abused:     Family History:  The patient's family history includes Hypertension in his father.   The patient He indicated that his mother is deceased. He indicated that his father is deceased. He indicated that his maternal grandmother is deceased. He indicated that his maternal grandfather is deceased. He indicated that his paternal grandmother is deceased. He indicated that his paternal grandfather is deceased.   ROS:  Please see the history  of present illness.  All other ROS reviewed and negative.     Physical Exam/Data:   Vitals:   07/29/19 1453 07/29/19 1915  BP: (!) 164/71 (!) 161/91  Pulse: 63 79  Resp: 16 15  Temp: 98.8 F (37.1 C)   TempSrc: Oral   SpO2: 97% 94%   No intake or output data in the 24 hours ending 07/29/19 1940 There were no vitals filed for this visit. There is no height or weight on file to calculate BMI.  General: Overweight elderly male in no acute distress at rest HEENT: normal Lymph: no adenopathy Neck:  JVD mildly elevated, positive hepatojugular reflux  Endocrine:  No thryomegaly Vascular: No carotid bruits; 4/4 extremity pulses 2+ bilaterally  Cardiac:  normal S1, S2; RRR with frequent ectopy; no murmur, no rub or gallop  Lungs: Coarse breath sounds bilaterally, no wheezing, rhonchi or rales  Abd: soft, nontender, no hepatomegaly  Ext: 1+ lower extremity edema Musculoskeletal:  No deformities, BUE and BLE strength normal and equal Skin: warm and dry  Neuro:  CNs 2-12 intact, no focal abnormalities noted Psych:  Normal affect    EKG:  The ECG was personally reviewed: 3/23 ECG is sinus rhythm with PACs and PVCs, heart rate 91, no acute ischemic changes  Relevant CV Studies:  No prior studies available for review.  Laboratory Data:  Chemistry Recent Labs  Lab 07/23/19 1138 07/29/19 1531  NA 139 133*  K 3.4* 3.5  CL 98 88*  CO2 28 31  GLUCOSE 230* 240*  BUN 12 31*  CREATININE 1.22 1.70*  CALCIUM 8.5* 9.1    GFRNONAA 57* 38*  GFRAA 66 44*  ANIONGAP  --  14    No results for input(s): PROT, ALBUMIN, AST, ALT, ALKPHOS, BILITOT in the last 168 hours. Hematology Recent Labs  Lab 07/23/19 1138 07/29/19 1531  WBC 9.2 11.3*  RBC 4.05* 4.38  HGB 10.7* 11.5*  HCT 34.1* 35.7*  MCV 84 81.5  MCH 26.4* 26.3  MCHC 31.4* 32.2  RDW 17.0* 16.5*  PLT 222 295   Cardiac Enzymes  High Sensitivity Troponin:  No results for input(s): TROPONINIHS in the last 720 hours.   BNP Recent Labs  Lab 07/23/19 1138 07/29/19 1531  BNP  --  65.0  PROBNP 1,962*  --     DDimer No results for input(s): DDIMER in the last 168 hours. Lipids: No results found for: CHOL, HDL, LDLCALC, LDLDIRECT, TRIG, CHOLHDL INR: No results found for: INR, PROTIME A1c: No results found for: HGBA1C Thyroid:  Lab Results  Component Value Date   TSH 2.270 07/23/2019    Radiology/Studies:  DG Chest 2 View  Result Date: 07/29/2019 CLINICAL DATA:  Intermittent shortness of breath for several weeks, remote history of COVID-19 EXAM: CHEST - 2 VIEW COMPARISON:  05/31/2004 FINDINGS: Frontal and lateral views of the chest demonstrate a stable cardiac silhouette. There is diffuse interstitial prominence, progressive since prior exam from 2006. No airspace disease, effusion, or pneumothorax. No acute bony abnormalities. IMPRESSION: 1. Diffuse interstitial prominence as above. I would favor progressive chronic interstitial lung disease and scarring rather than acute interstitial edema or atypical infection. Electronically Signed   By: Randa Ngo M.D.   On: 07/29/2019 16:25    Assessment and Plan:   1.  Presumed acute on chronic systolic CHF: -His symptoms have improved somewhat with the increase in Lasix dose, but he is not back to baseline and creatinine has increased. -He still has some volume overload by  exam. -Admit, diurese, go ahead and get the echo that was planned for outpatient -As he will be on IV Lasix, do not think we will  need the metolazone. -Continue carvedilol and uptitrate -Try to get records from his previous cardiologist  2.  Diabetes -We do not have an exact dosage on his Lantus, start sliding scale insulin and add back his home insulin as we are able  Otherwise, continue home meds  Principal Problem:   Acute on chronic systolic CHF (congestive heart failure) ([place])   For questions or updates, please contact [facility] Please consult [email or web address] for contact info under Cardiology/STEMI.    Jonetta Speak, PA-C 07/29/2019 7:40 PM    Attending note:  Patient seen and examined.  I reviewed the available records which are limited at this point.  Mr. Vasco recently established with Dr. Harriet Masson as a new patient on March 17.  He moved to this area from [place] about 3 weeks ago.  He reports a history of CAD status post previous infarct approximately 15 years ago and subsequent stent interventions, also PAD with left leg stent revascularization.  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  He states that he was in his usual state of health until January when he was diagnosed with COVID-19.  It sounds like he was briefly observed in the hospital mainly for supportive measures, did not require supplemental oxygen or other intensive treatments, largely recovered at home.  He feels like he has been more short of breath since that time, also his heart rate has been irregular.  He states that his weight has not changed significantly, he has fluctuating leg swelling.  He does describe orthopnea, also a feeling that he cannot settle down and rest well at nighttime.  He does not describe any angina symptoms and states that he has been taking his medications regularly.  Outpatient diuretics were intensified by Dr. Harriet Masson based on concerns for relative volume overload at his initial visit.  Lab work at that time showed glucose 230, creatinine 1.22,  potassium 3.4, magnesium 1.8, hemoglobin 10.7, TSH 2.27 and NT-proBNP 1962.  Patient family contacted office today regarding continued symptoms and patient was encouraged to come to the ER for further evaluation and admission.  On examination this evening he appears comfortable at rest, denies any active chest pain.  Does have a sense of heart skipping.  He is afebrile, systolic blood pressure 277A to 160s, heart rate in the 70s to 90s in sinus rhythm with frequent PVCs.  Lungs exhibit coarse breath sounds throughout without active wheezing.  Cardiac exam with RRR and frequent ectopy, indistinct PMI, no obvious S3.  Abdomen protuberant.  1+ lower leg edema.  Follow-up lab work shows worsening renal function with creatinine up to 1.7, potassium 3.5, BNP 65, hemoglobin 11.5, glucose 240.  Chest x-ray shows diffuse interstitial opacities, edema versus possibly chronic process, no old film for comparison.  Plan is hospitalization for further work-up as an inpatient.  Possibly acute on chronic systolic heart failure with volume overload, although could be multifactorial process (had COVID-19 in January, also apparently frequent PVCs).  Echocardiogram will be obtained.  Would hold Lasix this evening in light of acute renal insufficiency.  Continue low-dose Coreg, can further quantify PVC burden on telemetry.  Continue aspirin and Plavix in light of known vascular disease.  Would also obtain LFTs and a urinalysis.  Satira Sark, M.D., F.A.C.C.

## 2019-07-29 NOTE — Telephone Encounter (Signed)
New message   Patient's son states that the patient is at [facility] ER. His son would like a call to discuss.

## 2019-07-29 NOTE — Telephone Encounter (Signed)
Call returned to Pt's son.  Advised per Dr. Harriet Masson Pt needs to go to hospital for treatment.  Pt's son will take Pt to [facility] for treatment.  Will advise cardmaster Pt is on the way.  Will also advise Dr. Harriet Masson Pt going to [facility].

## 2019-07-29 NOTE — ED Provider Notes (Signed)
[facility] EMERGENCY DEPARTMENT Provider Note   CSN: [id number] Arrival date & time: 07/29/19  1452     History Chief Complaint  Patient presents with  . Congestive Heart Failure    Oscar Kim is a 79 y.o. male.  HPI   This patient is a 79 year old male, he has a known history of diabetes, hypertension, hyperlipidemia and coronary disease.  I have reviewed the prehospital medical record as he was seen at a cardiology office approximately 1 week ago, he has been diagnosed with an episode of syncope which was in [place] before he moved here a month ago.  He is now living here with family.  He has known congestive heart failure, taking Lasix, he is on multiple different medications.  He was found to have some PVCs on the monitor, he was seen by the cardiology office who also increased his Lasix and metolazone and he presents today after being told that he may need to be admitted to the hospital for IV diuretics.  The patient states that he is not actually short of breath right now and in fact his trouble with sleeping is that he cannot fall asleep not that he is short of breath.  In fact the swelling has gone down, he has been taking an increased dose of Lasix at 60 mg in the morning and 40 mg in the evening and has been feeling slightly fatigued with less energy.  He is not sleeping well at night and is frustrated by that, not taking any sleep medications.  He does not have any chest pain, he continues to dip though he does not smoke anymore for over 10 years.  The patient does complain of mild swelling of the legs but this is improved.  The patient did have severe hyperglycemia when he woke up this morning but states he does not watch his diet.  He is taking the medications as prescribed including the insulin.  Past Medical History:  Diagnosis Date  . CAD (coronary artery disease)   . DM (diabetes mellitus) ([place])   . Hyperlipidemia   . Hypertension   . Obesity (BMI  30-39.9)   . PAD (peripheral artery disease) [facility])     Patient Active Problem List   Diagnosis Date Noted  . Acute on chronic systolic CHF (congestive heart failure) ([place]) 07/29/2019  . 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 Congestive heart failure ([place]) 07/23/2019  . Shortness of breath 07/23/2019  . Fatigue 07/23/2019  . Coronary artery disease involving native coronary artery of native heart without angina pectoris 07/23/2019  . PAD (peripheral artery disease) ([place]) 07/23/2019  . Mixed hyperlipidemia 07/23/2019  . Essential hypertension 07/23/2019    Past Surgical History:  Procedure Laterality Date  . CORONARY ANGIOPLASTY WITH STENT PLACEMENT     When he lived in [place], multiple stents placed  . KNEE SURGERY Right        No family history on file.  Social History   Tobacco Use  . Smoking status: Former Smoker    Quit date: 07/23/1979    Years since quitting: 40.0  . Smokeless tobacco: Current User    Types: Snuff  Substance Use Topics  . Alcohol use: Never  . Drug use: Never    Home Medications Prior to Admission medications   Medication Sig Start Date End Date Taking? Authorizing Provider  allopurinol (ZYLOPRIM) 100 MG tablet Take 100 mg by mouth 2 (two) times daily.    [provider]  aspirin EC  81 MG tablet Take 81 mg by mouth daily.    [provider]  carvedilol (COREG) 3.125 MG tablet Take 3.125 mg by mouth 2 (two) times daily with a meal.    [provider]  clopidogrel (PLAVIX) 75 MG tablet Take 75 mg by mouth daily.    [provider]  doxycycline (VIBRAMYCIN) 100 MG capsule Take 100 mg by mouth 2 (two) times daily.    [provider]  furosemide (LASIX) 40 MG tablet Take 60 mg (1.5 tablet) in the morning and 40 mg (1 tablet) in the evening daily. 07/23/19   Tobb, Kardie, DO  gabapentin (NEURONTIN) 300 MG capsule Take 300 mg by mouth 3 (three) times daily.    [provider]  insulin NPH-regular  Human (70-30) 100 UNIT/ML injection Inject into the skin.    [provider]  metolazone (ZAROXOLYN) 5 MG tablet Take 1 tablet (5 mg total) by mouth daily. Take 30 minutes before your first dose of Lasix. 07/24/19 10/22/19  Tobb, Kardie, DO  omeprazole (PRILOSEC) 40 MG capsule Take 40 mg by mouth daily.    [provider]  potassium chloride SA (KLOR-CON) 20 MEQ tablet Take 1 tablet (20 mEq total) by mouth 2 (two) times daily. 07/24/19   Tobb, Kardie, DO  traMADol (ULTRAM) 50 MG tablet Take 50 mg by mouth in the morning, at noon, and at bedtime.    [provider]    Allergies    Patient has no known allergies.  Review of Systems   Review of Systems  All other systems reviewed and are negative.   Physical Exam Updated Vital Signs BP (!) 164/71 (BP Location: Left Arm)   Pulse 63   Temp 98.8 F (37.1 C) (Oral)   Resp 16   SpO2 97%   Physical Exam Vitals and nursing note reviewed.  Constitutional:      General: He is not in acute distress.    Appearance: He is well-developed.  HENT:     Head: Normocephalic and atraumatic.     Nose: Nose normal.     Mouth/Throat:     Pharynx: No oropharyngeal exudate.  Eyes:     General: No scleral icterus.       Right eye: No discharge.        Left eye: No discharge.     Conjunctiva/sclera: Conjunctivae normal.     Pupils: Pupils are equal, round, and reactive to light.  Neck:     Thyroid: No thyromegaly.     Vascular: No JVD.  Cardiovascular:     Rate and Rhythm: Normal rate. Rhythm irregular.     Heart sounds: Normal heart sounds. No murmur. No friction rub. No gallop.      Comments: Occasional PVCs Pulmonary:     Effort: Pulmonary effort is normal. No respiratory distress.     Breath sounds: Normal breath sounds. No wheezing or rales.     Comments: Clear lung sounds, speaks in full sentences, no tachypnea, no rales Abdominal:     General: Bowel sounds are normal. There is no distension.     Palpations:  Abdomen is soft. There is no mass.     Tenderness: There is no abdominal tenderness.  Musculoskeletal:        General: No tenderness. Normal range of motion.     Cervical back: Normal range of motion and neck supple.     Right lower leg: Edema present.     Left lower leg: Edema present.  Comments: Scant bilateral lower extremity edema  Lymphadenopathy:     Cervical: No cervical adenopathy.  Skin:    General: Skin is warm and dry.     Findings: No erythema or rash.  Neurological:     Mental Status: He is alert.     Coordination: Coordination normal.     Comments: Normal speech, normal level of alertness, normal coordination and strength  Psychiatric:        Behavior: Behavior normal.     ED Results / Procedures / Treatments   Labs (all labs ordered are listed, but only abnormal results are displayed) Labs Reviewed  BASIC METABOLIC PANEL - Abnormal; Notable for the following components:      Result Value   Sodium 133 (*)    Chloride 88 (*)    Glucose, Bld 240 (*)    BUN 31 (*)    Creatinine, Ser 1.70 (*)    GFR calc non Af Amer 38 (*)    GFR calc Af Amer 44 (*)    All other components within normal limits  CBC - Abnormal; Notable for the following components:   WBC 11.3 (*)    Hemoglobin 11.5 (*)    HCT 35.7 (*)    RDW 16.5 (*)    All other components within normal limits  BRAIN NATRIURETIC PEPTIDE    EKG EKG Interpretation  Date/Time:  Tuesday July 29 2019 15:30:42 EDT Ventricular Rate:  91 PR Interval:    QRS Duration: 94 QT Interval:  420 QTC Calculation: 516 R Axis:   33 Text Interpretation: Normal sinus rhythm with pac's and pvc's Prolonged QT Abnormal ECG No old tracing to compare Confirmed by Noemi Chapel [phone]) on 07/29/2019 6:47:36 PM   Radiology DG Chest 2 View  Result Date: 07/29/2019 CLINICAL DATA:  Intermittent shortness of breath for several weeks, remote history of COVID-19 EXAM: CHEST - 2 VIEW COMPARISON:  05/31/2004 FINDINGS: Frontal  and lateral views of the chest demonstrate a stable cardiac silhouette. There is diffuse interstitial prominence, progressive since prior exam from 2006. No airspace disease, effusion, or pneumothorax. No acute bony abnormalities. IMPRESSION: 1. Diffuse interstitial prominence as above. I would favor progressive chronic interstitial lung disease and scarring rather than acute interstitial edema or atypical infection. Electronically Signed   By: Randa Ngo M.D.   On: 07/29/2019 16:25    Procedures Procedures (including critical care time)  Medications Ordered in ED Medications  sodium chloride flush (NS) 0.9 % injection 3 mL (has no administration in time range)  allopurinol (ZYLOPRIM) tablet 100 mg (has no administration in time range)  aspirin EC tablet 81 mg (has no administration in time range)  carvedilol (COREG) tablet 6.25 mg (has no administration in time range)  clopidogrel (PLAVIX) tablet 75 mg (has no administration in time range)  gabapentin (NEURONTIN) capsule 300 mg (has no administration in time range)  pantoprazole (PROTONIX) EC tablet 40 mg (has no administration in time range)  potassium chloride SA (KLOR-CON) CR tablet 20 mEq (has no administration in time range)  traMADol (ULTRAM) tablet 50 mg (has no administration in time range)    ED Course  I have reviewed the triage vital signs and the nursing notes.  Pertinent labs & imaging results that were available during my care of the patient were reviewed by me and considered in my medical decision making (see chart for details).    MDM Rules/Calculators/A&P  I have reviewed this patient's labs, he has no acute findings on his exam, he is not hypoxic, tachycardic, febrile or hypotensive.  In fact he has very little edema and clear lungs.  I have personally viewed the patient's chest x-ray and there is no signs of pulmonary edema, possibly pulmonary fibrosis, no significant cardiomegaly.  The  patient's laboratory work-up reveals a BNP of 65, a CBC with a mild leukocytosis but no other significant findings and a metabolic panel showing a slight hyperglycemia at 240 with a slight acute kidney injury with a creatinine of 1.7.  The patient has been aggressively diuresed this week  His EKG shows ectopy,  I will discuss his care with the cardiologist on-call though at this time I do not think the patient needs to be admitted for IV diuresis, but may benefit from being inpatient to straighten out the medications and make sure that he does not have a worsening kidney injury  I also reviewed the prior medical record which shows a thyroid test TSH which was normal 1 week ago  D/w Suanne Marker with Cards - they want to admit to make sure he is straight on mediicne and with ihs recent increase in ectopy to make sure there is nothing going on with that either.  The patient does not appear to be fluid overloaded but would likely benefit from an observatory stay  Final Clinical Impression(s) / ED Diagnoses Final diagnoses:  Congestive heart failure, unspecified HF chronicity, unspecified heart failure type ([place])  AKI (acute kidney injury) ([place])      Noemi Chapel, MD 07/29/19 Kathyrn Drown

## 2019-07-30 ENCOUNTER — Inpatient Hospital Stay (HOSPITAL_COMMUNITY): Payer: Medicare Other

## 2019-07-30 DIAGNOSIS — I340 Nonrheumatic mitral (valve) insufficiency: Secondary | ICD-10-CM

## 2019-07-30 LAB — GLUCOSE, CAPILLARY
Glucose-Capillary: 430 mg/dL — ABNORMAL HIGH (ref 70–99)
Glucose-Capillary: 358 mg/dL — ABNORMAL HIGH (ref 70–99)
Glucose-Capillary: 164 mg/dL — ABNORMAL HIGH (ref 70–99)

## 2019-07-30 LAB — BASIC METABOLIC PANEL
Anion gap: 14 (ref 5–15)
BUN: 32 mg/dL — ABNORMAL HIGH (ref 8–23)
CO2: 30 mmol/L (ref 22–32)
Calcium: 9.2 mg/dL (ref 8.9–10.3)
Chloride: 86 mmol/L — ABNORMAL LOW (ref 98–111)
Creatinine, Ser: 1.76 mg/dL — ABNORMAL HIGH (ref 0.61–1.24)
GFR calc Af Amer: 43 mL/min — ABNORMAL LOW (ref >60)
GFR calc non Af Amer: 37 mL/min — ABNORMAL LOW (ref >60)
Glucose, Bld: 511 mg/dL (ref 70–99)
Potassium: 4.2 mmol/L (ref 3.5–5.1)
Sodium: 130 mmol/L — ABNORMAL LOW (ref 135–145)

## 2019-07-30 LAB — ECHOCARDIOGRAM COMPLETE
Height: 65 in
Weight: 3171.2 oz

## 2019-07-30 LAB — URINALYSIS, COMPLETE (UACMP) WITH MICROSCOPIC
Bacteria, UA: NONE SEEN
Bilirubin Urine: NEGATIVE
Glucose, UA: 150 mg/dL — AB
Hgb urine dipstick: NEGATIVE
Ketones, ur: NEGATIVE mg/dL
Nitrite: NEGATIVE
Protein, ur: 30 mg/dL — AB
Specific Gravity, Urine: 1.011 (ref 1.005–1.030)
pH: 7 (ref 5.0–8.0)

## 2019-07-30 LAB — SARS CORONAVIRUS 2 (TAT 6-24 HRS): SARS Coronavirus 2: NEGATIVE

## 2019-07-30 LAB — HEMOGLOBIN A1C
Hgb A1c MFr Bld: 8.5 % — ABNORMAL HIGH (ref 4.8–5.6)
Mean Plasma Glucose: 197.25 mg/dL

## 2019-07-30 LAB — HEPATIC FUNCTION PANEL
ALT: 17 U/L (ref 0–44)
AST: 35 U/L (ref 15–41)
Albumin: 3.4 g/dL — ABNORMAL LOW (ref 3.5–5.0)
Alkaline Phosphatase: 143 U/L — ABNORMAL HIGH (ref 38–126)
Bilirubin, Direct: 0.2 mg/dL (ref 0.0–0.2)
Indirect Bilirubin: 0.8 mg/dL (ref 0.3–0.9)
Total Bilirubin: 1 mg/dL (ref 0.3–1.2)
Total Protein: 7.6 g/dL (ref 6.5–8.1)

## 2019-07-30 MED ORDER — INSULIN ASPART PROT & ASPART (70-30 MIX) 100 UNIT/ML ~~LOC~~ SUSP: 34.0000 [IU] | Freq: Two times a day (BID) | SUBCUTANEOUS | Status: DC

## 2019-07-30 MED ORDER — INSULIN ASPART PROT & ASPART (70-30 MIX) 100 UNIT/ML ~~LOC~~ SUSP
20.0000 [IU] | Freq: Two times a day (BID) | SUBCUTANEOUS | Status: AC
  Administered 2019-07-30: 20 [IU] via SUBCUTANEOUS
  Filled 2019-07-30: qty 10

## 2019-07-30 MED ORDER — ENOXAPARIN SODIUM 30 MG/0.3ML ~~LOC~~ SOLN
30.0000 mg | SUBCUTANEOUS | Status: DC
  Administered 2019-07-30: 30 mg via SUBCUTANEOUS
  Filled 2019-07-30: qty 0.3

## 2019-07-30 MED ORDER — ONDANSETRON HCL 4 MG/2ML IJ SOLN: 4.0000 mg | Freq: Four times a day (QID) | INTRAMUSCULAR | Status: DC | PRN

## 2019-07-30 MED ORDER — SODIUM CHLORIDE 0.9% FLUSH: 3.0000 mL | INTRAVENOUS | Status: DC | PRN

## 2019-07-30 MED ORDER — ACETAMINOPHEN 325 MG PO TABS
650.0000 mg | ORAL_TABLET | ORAL | Status: DC | PRN
  Administered 2019-07-30 (×2): 650 mg via ORAL
  Filled 2019-07-30 (×2): qty 2

## 2019-07-30 MED ORDER — SODIUM CHLORIDE 0.9 % IV SOLN: 250.0000 mL | INTRAVENOUS | Status: DC | PRN

## 2019-07-30 MED ORDER — INSULIN ASPART PROT & ASPART (70-30 MIX) 100 UNIT/ML ~~LOC~~ SUSP
34.0000 [IU] | Freq: Two times a day (BID) | SUBCUTANEOUS | Status: DC
  Administered 2019-07-30 – 2019-07-31 (×2): 34 [IU] via SUBCUTANEOUS
  Filled 2019-07-30: qty 10

## 2019-07-30 MED ORDER — LORAZEPAM 0.5 MG PO TABS
0.5000 mg | ORAL_TABLET | Freq: Every day | ORAL | Status: AC
  Administered 2019-07-30: 0.5 mg via ORAL
  Filled 2019-07-30: qty 1

## 2019-07-30 MED ORDER — INSULIN NPH (HUMAN) (ISOPHANE) 100 UNIT/ML ~~LOC~~ SUSP: 34.0000 [IU] | Freq: Two times a day (BID) | SUBCUTANEOUS | Status: DC

## 2019-07-30 MED ORDER — SODIUM CHLORIDE 0.9% FLUSH
3.0000 mL | Freq: Two times a day (BID) | INTRAVENOUS | Status: DC
  Administered 2019-07-30 – 2019-07-31 (×3): 3 mL via INTRAVENOUS

## 2019-07-30 MED ORDER — INSULIN ASPART 100 UNIT/ML ~~LOC~~ SOLN: 0.0000 [IU] | Freq: Every day | SUBCUTANEOUS | Status: DC

## 2019-07-30 MED ORDER — ENOXAPARIN SODIUM 40 MG/0.4ML ~~LOC~~ SOLN
40.0000 mg | SUBCUTANEOUS | Status: DC
  Administered 2019-07-31: 40 mg via SUBCUTANEOUS
  Filled 2019-07-30: qty 0.4

## 2019-07-30 MED ORDER — INSULIN ASPART 100 UNIT/ML ~~LOC~~ SOLN
0.0000 [IU] | Freq: Three times a day (TID) | SUBCUTANEOUS | Status: DC
  Administered 2019-07-30 (×2): 9 [IU] via SUBCUTANEOUS
  Administered 2019-07-31: 5 [IU] via SUBCUTANEOUS
  Administered 2019-07-31: 2 [IU] via SUBCUTANEOUS

## 2019-07-30 NOTE — Progress Notes (Signed)
  Echocardiogram 2D Echocardiogram has been performed.  Oscar Kim 07/30/2019, 12:17 PM

## 2019-07-30 NOTE — Progress Notes (Addendum)
Progress Note  Patient Name: Oscar Kim Date of Encounter: 07/30/2019  Primary Cardiologist: Berniece Salines, DO ; previously   Subjective   His primary complaint at this time is lack of sleep. He does not attributed orthopnea or PND to his difficulty sleeping, though it looks like family suggested this was contributing. No complaints of chest pain, palpitations, dizziness, lightheadedness, or syncope. He has intermittent LE edema (R>L) which is improved with lasix and metolazone. He reports compliance with his medications. He does not recall being told he has a weakened heart muscle. He has had 2 prior cardiac stents 10+ years ago. Also with stenting to LLE more recent than cardiac stents though he does not recall the exact timing. He thinks maybe he was diagnosed with COPD in the past. He reports he quit smoking 35 years ago. He worked in [employer] for a long time and states "I probably have some lung damage from that."  Inpatient Medications    Scheduled Meds: . allopurinol  100 mg Oral BID  . aspirin EC  81 mg Oral Daily  . carvedilol  3.125 mg Oral BID WC  . clopidogrel  75 mg Oral Daily  . enoxaparin (LOVENOX) injection  30 mg Subcutaneous Q24H  . gabapentin  300 mg Oral TID  . insulin aspart  0-5 Units Subcutaneous QHS  . insulin aspart  0-9 Units Subcutaneous TID WC  . pantoprazole  40 mg Oral Daily  . potassium chloride SA  20 mEq Oral BID  . sodium chloride flush  3 mL Intravenous Once  . sodium chloride flush  3 mL Intravenous Q12H   Continuous Infusions: . sodium chloride     PRN Meds: sodium chloride, acetaminophen, ondansetron (ZOFRAN) IV, sodium chloride flush, traMADol   Vital Signs    Vitals:   07/30/19 0122 07/30/19 0514 07/30/19 0806 07/30/19 0941  BP:  (!) 174/86 (!) 141/75 119/87  Pulse:  88 73 77  Resp:  18 19   Temp:  98.2 F (36.8 C) 98.5 F (36.9 C)   TempSrc:  Oral Oral   SpO2:   97%   Weight:  89.9 kg    Height: 5\' 5"  (1.651 m)      No intake or  output data in the 24 hours ending 07/30/19 1000 Filed Weights   07/30/19 0052 07/30/19 0514  Weight: 89.9 kg 89.9 kg    Telemetry    Sinus rhythm with frequent ectopy, occasional brief runs of NSVT (max 4 beats) - Personally Reviewed  ECG    No new tracings - Personally Reviewed  Physical Exam   GEN: Sitting on the edge of his hospital bed in no acute distress.   Neck: No JVD, no carotid bruits Cardiac: RRR with frequent ectopy, no murmurs, rubs, or gallops.  Respiratory: Clear to auscultation bilaterally, no wheezes/ rales/ rhonchi GI: NABS, Soft, nontender, non-distended  MS: 1+ RLE edema, trace LLE edema; No deformity. Chronic venous stasis skin changes to b/l LE Neuro:  Nonfocal, moving all extremities spontaneously Psych: Normal affect   Labs    Chemistry Recent Labs  Lab 07/23/19 1138 07/29/19 1531 07/30/19 0422  NA 139 133*  --   K 3.4* 3.5  --   CL 98 88*  --   CO2 28 31  --   GLUCOSE 230* 240*  --   BUN 12 31*  --   CREATININE 1.22 1.70*  --   CALCIUM 8.5* 9.1  --   PROT  --   --  7.6  ALBUMIN  --   --  3.4*  AST  --   --  35  ALT  --   --  17  ALKPHOS  --   --  143*  BILITOT  --   --  1.0  GFRNONAA 57* 38*  --   GFRAA 66 44*  --   ANIONGAP  --  14  --      Hematology Recent Labs  Lab 07/23/19 1138 07/29/19 1531  WBC 9.2 11.3*  RBC 4.05* 4.38  HGB 10.7* 11.5*  HCT 34.1* 35.7*  MCV 84 81.5  MCH 26.4* 26.3  MCHC 31.4* 32.2  RDW 17.0* 16.5*  PLT 222 295    Cardiac EnzymesNo results for input(s): TROPONINI in the last 168 hours. No results for input(s): TROPIPOC in the last 168 hours.   BNP Recent Labs  Lab 07/23/19 1138 07/29/19 1531  BNP  --  65.0  PROBNP 1,962*  --      DDimer No results for input(s): DDIMER in the last 168 hours.   Radiology    DG Chest 2 View  Result Date: 07/29/2019 CLINICAL DATA:  Intermittent shortness of breath for several weeks, remote history of COVID-19 EXAM: CHEST - 2 VIEW COMPARISON:  05/31/2004  FINDINGS: Frontal and lateral views of the chest demonstrate a stable cardiac silhouette. There is diffuse interstitial prominence, progressive since prior exam from 2006. No airspace disease, effusion, or pneumothorax. No acute bony abnormalities. IMPRESSION: 1. Diffuse interstitial prominence as above. I would favor progressive chronic interstitial lung disease and scarring rather than acute interstitial edema or atypical infection. Electronically Signed   By: Randa Ngo M.D.   On: 07/29/2019 16:25    Cardiac Studies   Echo pending  Patient Profile     79 y.o. 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  Assessment & Plan    1. Possible acute on chronic combined CHF: patient described some orthopnea on admission though denies now. Main complaint is that he cant sleep. BNP was only 65. CXR with chronic interstitial lung disease findings, without overt edema. Spoke to medical records office with his former cardiologist, Dr. Dorma Russell, who will fax over recent office notes. They informed me he has not had an echo since 2016. His lasix and metolazone have been held this admission due to AKI.  - Will check an echocardiogram - Could consider transition from lasix to torsemide given daily metolazone use.  - Continue carvedilol  - Continue to monitor strict I&Os and daily weights.   2. CAD s/p remote PCI x2: no anginal complaints. EKG non-ischemic. He reports being on a statin in the past but "it made me sick".  - Continue aspirin and plavix  3. HTN: BP overall stable this morning - Continue carvedilol  4. DM type 2: poorly controlled with A1C 8.5. Now with BG 511 this morning after home insulin not continued.  - Spoke with the Diabetes Coordinator, Larene Beach. She recommends 70/30 novolog 20 units now, followed by home 34 units BID  starting with dinner. They will follow along for additional recommendations  5. HLD: no recent lipids. He reports intolerance to unknown statin in the past. Await records from prior cardiologist - Will f/u [facility] in AM  6. AoCKD stage 3a: Cr up to 1.76 today; this was 1.22 07/23/19.  -  Continue to monitor closely  For questions or updates, please contact [place] Please consult [email or web address] for contact info under Cardiology/STEMI.      Signed, Abigail Butts, PA-C  07/30/2019, 10:00 AM   [phone]  Patient seen and examined with K Kroeger PA-C.  Agree as above, with the following exceptions and changes as noted below. Patient appears euvolemic and has no significant shortness of breath. Appears anxious, and family's primary concern is his lack of sleep due to having a lot on his mind. Elevated BNP several days ago, seems to have responded to increased dose of diuretic at home. Gen: NAD, CV: RRR, no murmurs, Lungs: clear, Abd: soft, Extrem: Warm, well perfused, no edema, Neuro/Psych: alert and oriented x 3, normal mood and affect. All available labs, radiology testing, previous records reviewed. Medication reconciliation has been challenging, inconsistent report vs reports from his cardiologists.  Agree as above. Patient's blood sugar poorly controlled today without information about appropriate dose, will monitor overnight and plan for discharge tomorrow if medically stable. No current indication for inpatient diuresis.    Elouise Munroe 07/30/19 9:46 PM

## 2019-07-30 NOTE — Progress Notes (Signed)
Inpatient Diabetes Program Recommendations  AACE/ADA: New Consensus Statement on Inpatient Glycemic Control (2015)  Target Ranges:  Prepandial:   less than 140 mg/dL      Peak postprandial:   less than 180 mg/dL (1-2 hours)      Critically ill patients:  140 - 180 mg/dL   Lab Results  Component Value Date   HGBA1C 8.5 (H) 07/30/2019   Results for JABRE, HEO (MRN [id number]) as of 07/30/2019 11:17  Ref. Range 07/30/2019 10:02  Sodium Latest Ref Range: 135 - 145 mmol/L 130 (L)  Potassium Latest Ref Range: 3.5 - 5.1 mmol/L 4.2  Chloride Latest Ref Range: 98 - 111 mmol/L 86 (L)  CO2 Latest Ref Range: 22 - 32 mmol/L 30  Glucose Latest Ref Range: 70 - 99 mg/dL 511 (HH)  Mean Plasma Glucose Latest Units: mg/dL 197.25  BUN Latest Ref Range: 8 - 23 mg/dL 32 (H)  Creatinine Latest Ref Range: 0.61 - 1.24 mg/dL 1.76 (H)  Calcium Latest Ref Range: 8.9 - 10.3 mg/dL 9.2  Anion gap Latest Ref Range: 5 - 15  14   Review of Glycemic Control  Diabetes history: DM 2 Outpatient Diabetes medications: 70/30 34 units bid Current orders for Inpatient glycemic control:  70/30 34 units bid Novolog 0-9 units tid + hs  A1c 8.5% on 3/24 BUN/Creat: 32/1.76  Inpatient Diabetes Program Recommendations:    Glucose 511 this am. Insulin regimen added.  Spoke with Roby Lofts, PA. Reduce current 70/30 dose to 20 units and resume 34 units this evening with renal function elevated.  Will follow trends closely.  Thanks,  Tama Headings RN, MSN, BC-ADM Inpatient Diabetes Coordinator Team Pager [phone] (8a-5p)

## 2019-07-31 DIAGNOSIS — R0602 Shortness of breath: Secondary | ICD-10-CM

## 2019-07-31 DIAGNOSIS — Z794 Long term (current) use of insulin: Secondary | ICD-10-CM

## 2019-07-31 DIAGNOSIS — E119 Type 2 diabetes mellitus without complications: Secondary | ICD-10-CM

## 2019-07-31 DIAGNOSIS — I5022 Chronic systolic (congestive) heart failure: Secondary | ICD-10-CM

## 2019-07-31 HISTORY — DX: Long term (current) use of insulin: Z79.4

## 2019-07-31 HISTORY — DX: Type 2 diabetes mellitus without complications: E11.9

## 2019-07-31 LAB — CBC
HCT: 36.6 % — ABNORMAL LOW (ref 39.0–52.0)
Hemoglobin: 11.9 g/dL — ABNORMAL LOW (ref 13.0–17.0)
MCH: 26.4 pg (ref 26.0–34.0)
MCHC: 32.5 g/dL (ref 30.0–36.0)
MCV: 81.2 fL (ref 80.0–100.0)
Platelets: 275 10*3/uL (ref 150–400)
RBC: 4.51 MIL/uL (ref 4.22–5.81)
RDW: 16.7 % — ABNORMAL HIGH (ref 11.5–15.5)
WBC: 10.1 10*3/uL (ref 4.0–10.5)
nRBC: 0 % (ref 0.0–0.2)

## 2019-07-31 LAB — BASIC METABOLIC PANEL
Anion gap: 13 (ref 5–15)
BUN: 33 mg/dL — ABNORMAL HIGH (ref 8–23)
CO2: 30 mmol/L (ref 22–32)
Calcium: 9.3 mg/dL (ref 8.9–10.3)
Chloride: 93 mmol/L — ABNORMAL LOW (ref 98–111)
Creatinine, Ser: 1.84 mg/dL — ABNORMAL HIGH (ref 0.61–1.24)
GFR calc Af Amer: 40 mL/min — ABNORMAL LOW (ref >60)
GFR calc non Af Amer: 35 mL/min — ABNORMAL LOW (ref >60)
Glucose, Bld: 123 mg/dL — ABNORMAL HIGH (ref 70–99)
Potassium: 3.9 mmol/L (ref 3.5–5.1)
Sodium: 136 mmol/L (ref 135–145)

## 2019-07-31 LAB — LIPID PANEL
Cholesterol: 237 mg/dL — ABNORMAL HIGH (ref 0–200)
HDL: 31 mg/dL — ABNORMAL LOW (ref >40)
LDL Cholesterol: 168 mg/dL — ABNORMAL HIGH (ref 0–99)
Total CHOL/HDL Ratio: 7.6 RATIO
Triglycerides: 192 mg/dL — ABNORMAL HIGH (ref <150)
VLDL: 38 mg/dL (ref 0–40)

## 2019-07-31 LAB — GLUCOSE, CAPILLARY
Glucose-Capillary: 167 mg/dL — ABNORMAL HIGH (ref 70–99)
Glucose-Capillary: 290 mg/dL — ABNORMAL HIGH (ref 70–99)

## 2019-07-31 MED ORDER — FUROSEMIDE 40 MG PO TABS: 40.0000 mg | ORAL_TABLET | Freq: Two times a day (BID) | ORAL | 3 refills | Status: DC

## 2019-07-31 MED ORDER — CARVEDILOL 6.25 MG PO TABS: 3.1250 mg | ORAL_TABLET | Freq: Two times a day (BID) | ORAL | 2 refills | Status: DC

## 2019-07-31 MED ORDER — PANTOPRAZOLE SODIUM 40 MG PO TBEC: 40.0000 mg | DELAYED_RELEASE_TABLET | Freq: Every day | ORAL | 1 refills | Status: DC

## 2019-07-31 NOTE — Discharge Instructions (Signed)
Medication Changes: - INCREASE Carvedilol (Coreg) to 6.25mg  twice daily. - DECREASE Lasix to 40mg  twice daily. - STOP Metolazone.  - STOP Omeprazole and START Protonix instead.   Heart Failure Education: 1. Weigh yourself EVERY morning after you go to the bathroom but before you eat or drink anything. Write this number down in a weight log/diary. If you gain 3 pounds overnight or 5 pounds in a week, call the office. 2. Take your medicines as prescribed. If you have concerns about your medications, please call us before you stop taking them.  3. Eat low salt foods--Limit salt (sodium) to 2000 mg per day. This will help prevent your body from holding onto fluid. Read food labels as many processed foods have a lot of sodium, especially canned goods and prepackaged meats. If you would like some assistance choosing low sodium foods, we would be happy to set you up with a nutritionist. 4. Stay as active as you can everyday. Staying active will give you more energy and make your muscles stronger. Start with 5 minutes at a time and work your way up to 30 minutes a day. Break up your activities--do some in the morning and some in the afternoon. Start with 3 days per week and work your way up to 5 days as you can.  If you have chest pain, feel short of breath, dizzy, or lightheaded, STOP. If you don't feel better after a short rest, call 911. If you do feel better, call the office to let us know you have symptoms with exercise. 5. Limit all fluids for the day to less than 2 liters. Fluid includes all drinks, coffee, juice, ice chips, soup, jello, and all other liquids.

## 2019-07-31 NOTE — Discharge Summary (Addendum)
Discharge Summary    Patient ID: Oscar Kim MRN: [id number]; DOB: 13-Jul-1940  Admit date: 07/29/2019 Discharge date: 07/31/2019  Primary Care Provider: [name], NP  Primary Cardiologist: Berniece Salines, DO  Primary Electrophysiologist:  None   Discharge Diagnoses    Principal Problem:   Dyspnea Active Problems:   Chronic systolic CHF (congestive heart failure) ([place])   Coronary artery disease involving native coronary artery of native heart without angina pectoris   Frequent PVCs   Mixed hyperlipidemia   Essential hypertension   Type 2 diabetes mellitus without complication, with long-term current use of insulin [facility])    Diagnostic Studies/Procedures    Echocardiogram 07/30/2019: Impressions: 1. Limited apicals due to patient not cooperating . Left ventricular  ejection fraction, by estimation, is 60 to 65%. The left ventricle has  normal function. The left ventricle has no regional wall motion  abnormalities. There is moderate left ventricular  hypertrophy. Left ventricular diastolic parameters are indeterminate.  2. Right ventricular systolic function is normal. The right ventricular  size is normal.  3. Left atrial size was mildly dilated.  4. The mitral valve is degenerative. Mild mitral valve regurgitation. No  evidence of mitral stenosis.  5. The aortic valve is tricuspid. Aortic valve regurgitation is trivial.  Mild aortic valve sclerosis is present, with no evidence of aortic valve  stenosis.  6. The inferior vena cava is normal in size with greater than 50%  respiratory variability, suggesting right atrial pressure of 3 mmHg. _____________   History of Present Illness     Oscar Kim is a 79 y.o. male with a history of CAD with previous infarct and PCI (15 years ago), chronic systolic CHF, PAD s/p stenting, hypertension, hyperlipidemia, type 2 diabetes mellitus, and obesity who is followed by Dr. Harriet Masson. Patient was recently seen by Dr. Harriet Masson on  07/23/2019 at which time he reported worsening shortness of breath. He was volume overloaded on exam. His Lasix was increased to 60mg  in the morning and 40mg  in the evening. Looks like he was also started on Metolazone 5mg  daily. There was concern about frequent  PVCs on EKG so a 3-day Zio monitor was ordered to assess PVC burden. Pro BNP from this visit came back elevated at 1,962.   Patient's family called our office on 07/29/2019 reporting worsening shortness of breath with multi-pillow orthopnea. He was instructed to go to the ED for further evaluation. However in the ED, patient reported that he thought he was breathing a little better. No shortness of breath at rest. He did report some orthopnea which he stated he has had for a while and thought that some of it may be just insomnia. He described PND as well. He reported increased urination on high dose of diuretics as well as increased lower extremity edema.   In the ED, repeat BNP in the ED was normal at 65. Chest x-ray showed diffuse interstitial prominence as above felt to be more consistent with progressive chronic interstitial lung disease and scarring rather than acute interstitial edema or infection. WBC 11.3, Hgb 11.5, Plts 295. Na 133, K 3.5, Glucose, BUN 31, Cr 1.70 (baseline 1.2). COVID-19 negative.    Hospital Course     Consultants: Diabetes Coordinator   Dyspnea with Chronic Systolic CHF Patient was admitted due to worsening shortness of breath. There was initially concern for possible acute on chronic CHF. However, symptoms and BNP improved after diuretics increased at recent office visit. Patient with mild lower extremity edema but otherwise euvolemic on  exam. Therefore, diuresis was held on admission due to worsening renal function. Echo showed LVEF of 60-65% with normal wall motion and mild MR. After admission, family stated their primary concern was his lack of sleep due to having a lot on his mind moreso than true orthopnea. On  day of discharge, family states this is the best patient has looked in several weeks and this was without any additional diuresis. Will discontinue Metolazone at discharge and decrease Lasix to 40mg  twice daily. Will increase Coreg to 6.25mg  twice daily. Will give additional CHF instruction in discharge paperwork that discusses importance of daily weights and sodium/fluid instructions.  CAD s/p Remote PCI Stable. No anginal complaints. EKG with no acute ischemic changes. Continue dual antiplatelet therapy with Plavix and Aspirin. Patient not on statin due to previous intolerance.  Frequent PVCs - 34% burden from prior cardiology notes Frequent PVCs and short runs of non-sustained VT noted on telemetry. Electrolytes supplemented as needed. Will increase Coreg to 6.25mg  twice daily. Results of outpatient Zio Monitor pending. Consider outpatient sleep study for evaluation of sleep apnea given reports of loud snoring.  Hypertension BP mildly elevated. Will increase Coreg to 6.25mg  twice daily. Continue diuresis as above.  Hyperlipidemia Lipid panel this admission: Total Cholesterol 237, Triglycerides 192, HDL 31, LDL 168. LDL goal <70 given CAD. Not on statin due to previous intolerance. Consider referral to lipid clinic for PCSK9 inhibitor as outpatient.   Type 2 Diabetes Mellitus Diabetes poorly controlled hemoglobin A1c 8.5% this admission. Home insulin was not continued on admission and CBG on 07/30/2019 was 511. Therefore, diabetes coordinator was consulted. CBG improved after restarting Novolog. Will continue home medications at discharge. Follow-up with PCP (patient already has follow-up visit scheduled for next week).   Acute Kidney Injury Creatinine 1.70 on admission and up to 1.84 today on discharge. Baseline creatinine 1.22 on 07/23/2019 at most recent visit with Dr. Harriet Masson. Decrease diuretics as above. Repeat BMET at follow-up with PCP next Monday 08/04/2019. Asked patient to have this faxed to  our office.   Patient seen and examined by Dr. Margaretann Loveless today and determined to be stable for discharge. Patient already has follow-up with Dr. Harriet Masson scheduled for 08/22/2019 and with PCP on 08/04/2019. Medications as below.  Did the patient have an acute coronary syndrome (MI, NSTEMI, STEMI, etc) this admission?:  No                               Did the patient have a percutaneous coronary intervention (stent / angioplasty)?:  No.   _____________  Discharge Vitals Blood pressure (!) 148/91, pulse 86, temperature 98 F (36.7 C), temperature source Oral, resp. rate 18, height 5\' 5"  (1.651 m), weight 89.4 kg, SpO2 99 %.  Filed Weights   07/30/19 0052 07/30/19 0514 07/31/19 0528  Weight: 89.9 kg 89.9 kg 89.4 kg   General: 79 y.o. male resting comfortably in no acute distress. HEENT: Normocephalic and atraumatic.  Neck: Supple. No JVD. Heart: RRR with frequent ectopy. Distinct S1 and S2. No murmurs, gallops, or rubs. Radial pulses 2+ and equal bilaterally. Lungs: No increased work of breathing. Clear to ausculation bilaterally. No significant wheezes, rhonchi, or rales.  Abdomen: Soft, non-distended, and non-tender to palpation. Bowel sounds present. Extremities: Trace to 1+ pitting edema of bilateral lower extremities.    Skin: Warm and dry. Neuro: No focal deficits. Psych: Normal affect. Responds appropriately.   Labs & Radiologic Studies  CBC Recent Labs    07/29/19 1531 07/31/19 0256  WBC 11.3* 10.1  HGB 11.5* 11.9*  HCT 35.7* 36.6*  MCV 81.5 81.2  PLT 295 355   Basic Metabolic Panel Recent Labs    07/30/19 1002 07/31/19 0256  NA 130* 136  K 4.2 3.9  CL 86* 93*  CO2 30 30  GLUCOSE 511* 123*  BUN 32* 33*  CREATININE 1.76* 1.84*  CALCIUM 9.2 9.3   Liver Function Tests Recent Labs    07/30/19 0422  AST 35  ALT 17  ALKPHOS 143*  BILITOT 1.0  PROT 7.6  ALBUMIN 3.4*   No results for input(s): LIPASE, AMYLASE in the last 72 hours. High Sensitivity Troponin:     No results for input(s): TROPONINIHS in the last 720 hours.  BNP Invalid input(s): POCBNP D-Dimer No results for input(s): DDIMER in the last 72 hours. Hemoglobin A1C Recent Labs    07/30/19 1002  HGBA1C 8.5*   Fasting Lipid Panel Recent Labs    07/31/19 0256  CHOL 237*  HDL 31*  LDLCALC 168*  TRIG 192*  CHOLHDL 7.6   Thyroid Function Tests No results for input(s): TSH, T4TOTAL, T3FREE, THYROIDAB in the last 72 hours.  Invalid input(s): FREET3 _____________  DG Chest 2 View  Result Date: 07/29/2019 CLINICAL DATA:  Intermittent shortness of breath for several weeks, remote history of COVID-19 EXAM: CHEST - 2 VIEW COMPARISON:  05/31/2004 FINDINGS: Frontal and lateral views of the chest demonstrate a stable cardiac silhouette. There is diffuse interstitial prominence, progressive since prior exam from 2006. No airspace disease, effusion, or pneumothorax. No acute bony abnormalities. IMPRESSION: 1. Diffuse interstitial prominence as above. I would favor progressive chronic interstitial lung disease and scarring rather than acute interstitial edema or atypical infection. Electronically Signed   By: Randa Ngo M.D.   On: 07/29/2019 16:25   ECHOCARDIOGRAM COMPLETE  Result Date: 07/30/2019    ECHOCARDIOGRAM REPORT   Patient Name:   Oscar Kim  Date of Exam: 07/30/2019 Medical Rec #:  [id number]  Height:       65.0 in Accession #:    [id number] Weight:       198.2 lb Date of Birth:  [birth year]   BSA:          1.971 m Patient Age:    56 years   BP:           119/87 mmHg Patient Gender: M          HR:           66 bpm. Exam Location:  Inpatient Procedure: 2D Echo, Cardiac Doppler and Color Doppler Indications:    R06.02 SOB  History:        Patient has no prior history of Echocardiogram examinations.                 CHF, CAD, Abnormal ECG, Signs/Symptoms:Dyspnea and Syncope; Risk                 Factors:Hypertension, Dyslipidemia and Diabetes.  Sonographer:    [place] Referring Phys:  [id number] Abigail Butts  Sonographer Comments: Technically difficult study due to poor echo windows. Image acquisition challenging due to uncooperative patient. Patient could not lie still. Patient constantly moving throughout apicals and the rest of the study. EKG would not work due to movement. IMPRESSIONS  1. Limited apicals due to patient not cooperating . Left ventricular ejection fraction, by estimation, is 60 to 65%. The left ventricle has  normal function. The left ventricle has no regional wall motion abnormalities. There is moderate left ventricular  hypertrophy. Left ventricular diastolic parameters are indeterminate.  2. Right ventricular systolic function is normal. The right ventricular size is normal.  3. Left atrial size was mildly dilated.  4. The mitral valve is degenerative. Mild mitral valve regurgitation. No evidence of mitral stenosis.  5. The aortic valve is tricuspid. Aortic valve regurgitation is trivial. Mild aortic valve sclerosis is present, with no evidence of aortic valve stenosis.  6. The inferior vena cava is normal in size with greater than 50% respiratory variability, suggesting right atrial pressure of 3 mmHg. FINDINGS  Left Ventricle: Limited apicals due to patient not cooperating. Left ventricular ejection fraction, by estimation, is 60 to 65%. The left ventricle has normal function. The left ventricle has no regional wall motion abnormalities. The left ventricular internal cavity size was normal in size. There is moderate left ventricular hypertrophy. Left ventricular diastolic parameters are indeterminate. Right Ventricle: The right ventricular size is normal. No increase in right ventricular wall thickness. Right ventricular systolic function is normal. Left Atrium: Left atrial size was mildly dilated. Right Atrium: Right atrial size was normal in size. Pericardium: There is no evidence of pericardial effusion. Mitral Valve: The mitral valve is degenerative in appearance.  There is moderate thickening of the mitral valve leaflet(s). There is moderate calcification of the mitral valve leaflet(s). Normal mobility of the mitral valve leaflets. Severe mitral annular  calcification. Mild mitral valve regurgitation. No evidence of mitral valve stenosis. MV peak gradient, 12.1 mmHg. The mean mitral valve gradient is 4.0 mmHg. Tricuspid Valve: The tricuspid valve is normal in structure. Tricuspid valve regurgitation is not demonstrated. No evidence of tricuspid stenosis. Aortic Valve: The aortic valve is tricuspid. Aortic valve regurgitation is trivial. Mild aortic valve sclerosis is present, with no evidence of aortic valve stenosis. Pulmonic Valve: The pulmonic valve was normal in structure. Pulmonic valve regurgitation is not visualized. No evidence of pulmonic stenosis. Aorta: The aortic root is normal in size and structure. Venous: The inferior vena cava is normal in size with greater than 50% respiratory variability, suggesting right atrial pressure of 3 mmHg. IAS/Shunts: No atrial level shunt detected by color flow Doppler.  LEFT VENTRICLE PLAX 2D LVIDd:         4.89 cm      Diastology LVIDs:         3.38 cm      LV e' lateral:   5.00 cm/s LV PW:         1.49 cm      LV E/e' lateral: 27.0 LV IVS:        1.47 cm      LV e' medial:    5.77 cm/s LVOT diam:     2.30 cm      LV E/e' medial:  23.4 LV SV:         86 LV SV Index:   43 LVOT Area:     4.15 cm  LV Volumes (MOD) LV vol d, MOD A2C: 109.0 ml LV vol d, MOD A4C: 101.0 ml LV vol s, MOD A2C: 26.2 ml LV vol s, MOD A4C: 40.5 ml LV SV MOD A2C:     82.8 ml LV SV MOD A4C:     101.0 ml LV SV MOD BP:      80.6 ml RIGHT VENTRICLE             IVC RV S prime:  12.40 cm/s  IVC diam: 1.67 cm TAPSE (M-mode): 1.8 cm LEFT ATRIUM             Index       RIGHT ATRIUM           Index LA diam:        3.80 cm 1.93 cm/m  RA Area:     15.30 cm LA Vol (A2C):   47.4 ml 24.05 ml/m RA Volume:   33.40 ml  16.95 ml/m LA Vol (A4C):   60.3 ml 30.60 ml/m LA  Biplane Vol: 56.8 ml 28.82 ml/m  AORTIC VALVE LVOT Vmax:   88.80 cm/s LVOT Vmean:  53.100 cm/s LVOT VTI:    0.206 m  AORTA Ao Root diam: 3.50 cm Ao Asc diam:  3.40 cm MITRAL VALVE MV Area (PHT): 2.32 cm     SHUNTS MV Peak grad:  12.1 mmHg    Systemic VTI:  0.21 m MV Mean grad:  4.0 mmHg     Systemic Diam: 2.30 cm MV Vmax:       1.74 m/s MV Vmean:      88.2 cm/s MV Decel Time: 327 msec MV E velocity: 135.00 cm/s MV A velocity: 147.00 cm/s MV E/A ratio:  0.92 Jenkins Rouge MD Electronically signed by Jenkins Rouge MD Signature Date/Time: 07/30/2019/1:04:42 PM    Final    Disposition   Patient is being discharged home today in good condition.  Follow-up Plans & Appointments    Follow-up Information    Tobb, Kardie, DO Follow up.   Specialty: Cardiology Why: You have a follow-up with Dr. Harriet Masson scheduled for 08/22/2019 at 10:40am.  Contact information: [address] [place] [address] [place], [place], NP Follow up.   Specialty: Family Medicine Why: Please keep follow-up appointment with your primary care provider already scheduled for next week.  Contact information: [address] [place] [address] [phone]          Discharge Instructions    Ambulatory referral to Pulmonology   Complete by: As directed    Reason for referral: Interstitial Lung Disease(ILD)   Diet - low sodium heart healthy   Complete by: As directed    Increase activity slowly   Complete by: As directed       Discharge Medications   Allergies as of 07/31/2019   No Known Allergies     Medication List    STOP taking these medications   metolazone 5 MG tablet Commonly known as: ZAROXOLYN   metoprolol succinate 25 MG 24 hr tablet Commonly known as: TOPROL-XL   omeprazole 40 MG capsule Commonly known as: PRILOSEC Replaced by: pantoprazole 40 MG tablet     TAKE these medications   allopurinol 100 MG tablet Commonly known as: ZYLOPRIM Take 100 mg by mouth 2  (two) times daily.   aspirin EC 81 MG tablet Take 81 mg by mouth daily.   carvedilol 6.25 MG tablet Commonly known as: COREG Take 0.5 tablets (3.125 mg total) by mouth 2 (two) times daily with a meal. What changed: medication strength   clopidogrel 75 MG tablet Commonly known as: PLAVIX Take 75 mg by mouth daily.   furosemide 40 MG tablet Commonly known as: Lasix Take 1 tablet (40 mg total) by mouth 2 (two) times daily. What changed:   how much to take  how to take this  when to take this  additional instructions   gabapentin 300 MG  capsule Commonly known as: NEURONTIN Take 300 mg by mouth 3 (three) times daily.   insulin NPH-regular Human (70-30) 100 UNIT/ML injection Inject 34 Units into the skin 2 (two) times daily with a meal.   pantoprazole 40 MG tablet Commonly known as: PROTONIX Take 1 tablet (40 mg total) by mouth daily. Start taking on: August 01, 2019 Replaces: omeprazole 40 MG capsule   potassium chloride SA 20 MEQ tablet Commonly known as: KLOR-CON Take 1 tablet (20 mEq total) by mouth 2 (two) times daily.   traMADol 50 MG tablet Commonly known as: ULTRAM Take 50 mg by mouth in the morning, at noon, and at bedtime.          Outstanding Labs/Studies   Repeat BMET at follow-up visit with PCP on 08/04/2019.  Duration of Discharge Encounter   Greater than 30 minutes including physician time.  Signed, Darreld Mclean, PA-C 07/31/2019, 1:14 PM  Patient seen and examined with Sande Rives, PA-C.  Agree as above, with the following exceptions and changes as noted below.  Patient is very eager to go home.  Feels well.  Continues to appear euvolemic today.  Blood sugar better controlled.  He did receive 0.5 mg of Ativan yesterday evening and tells me he slept very well.  We have discussed multiple times the contraindications to benzodiazepine use in patients over the age of 67, with concern for daytime drowsiness and risk of falls. Gen: NAD, CV:  RRR, no murmurs, Lungs: clear, Abd: soft, Extrem: Warm, well perfused, no edema, Neuro/Psych: alert and oriented x 3, normal mood and affect. All available labs, radiology testing, previous records reviewed.  Patient requests something to help him sleep at home.  He has close follow-up with his primary care doctor.  I have explained to the patient that it would not be ideal for a cardiologist to prescribe a sleep aid, and etiology of his sleep disturbance should be investigated further.  He also likely has sleep apnea with loud snoring endorsed by his wife, this may be contributing to his sleep disturbance as well as his frequent PVCs.  Medication reconciliation performed by Roby Lofts, PA-C yesterday, it appears he may have been taking metolazone daily, and both furosemide and torsemide were on his medication list.  Given acute kidney injury likely secondary to adequate diuresis as recommended by his outpatient cardiologist, we will reduce his Lasix to 40 mg twice daily, and anticipate close follow-up with his PCP and Dr. Harriet Masson for further recommendations.  Could consider torsemide if Lasix is inadequate.  Counseled patient on heart failure guidelines for diet lifestyle.  It seems his primary concern this hospitalization was an inability to sleep at night.  No active cardiovascular issues at time of discharge.  Elouise Munroe 07/31/19 1:23 PM

## 2019-08-19 ENCOUNTER — Other Ambulatory Visit: Payer: Self-pay

## 2019-08-19 ENCOUNTER — Ambulatory Visit (INDEPENDENT_AMBULATORY_CARE_PROVIDER_SITE_OTHER): Payer: Medicare Other

## 2019-08-19 ENCOUNTER — Ambulatory Visit (INDEPENDENT_AMBULATORY_CARE_PROVIDER_SITE_OTHER): Payer: Medicare Other | Admitting: Orthopaedic Surgery

## 2019-08-19 ENCOUNTER — Encounter: Payer: Self-pay | Admitting: Cardiology

## 2019-08-19 ENCOUNTER — Encounter: Payer: Self-pay | Admitting: Orthopaedic Surgery

## 2019-08-19 ENCOUNTER — Telehealth: Payer: Self-pay | Admitting: Radiology

## 2019-08-19 DIAGNOSIS — M1711 Unilateral primary osteoarthritis, right knee: Secondary | ICD-10-CM

## 2019-08-19 DIAGNOSIS — I2510 Atherosclerotic heart disease of native coronary artery without angina pectoris: Secondary | ICD-10-CM | POA: Diagnosis not present

## 2019-08-19 NOTE — Telephone Encounter (Signed)
Submitted for VOB for Monovisc-Right knee

## 2019-08-19 NOTE — Progress Notes (Signed)
Office Visit Note   Patient: Oscar Kim           Date of Birth: Mar 30, 1941           MRN: [id number] Visit Date: 08/19/2019              Requested by: No referring provider defined for this encounter. PCP: [name], NP   Assessment & Plan: Visit Diagnoses:  1. Primary osteoarthritis of right knee     Plan: Explained to Oscar Kim would not recommend a cortisone injection due to his poorly controlled diabetes.  We will try to get a supplemental injection for his knee.  Having follow-up once this is available.  Questions were encouraged and answered by Oscar Kim and myself today.  Follow-Up Instructions: Return for Supplemental injection.   Orders:  Orders Placed This Encounter  Procedures  . XR Knee 1-2 Views Right   No orders of the defined types were placed in this encounter.     Procedures: No procedures performed   Clinical Data: No additional findings.   Subjective: Chief Complaint  Patient presents with  . Right Knee - Pain    HPI Oscar Kim is a 79 year old male comes in today for right knee pain.  He has had chronic right knee pain.  Reports the normal injury some 30 years ago to use right knee.  Pain is mostly posterior aspect of the knee notes no swelling in the knee.  Pain is worse with any ambulation.  He is recently moved to the area from [place].  States the last injection in his right knee was 4 months ago with cortisone and is getting good relief for some time.  He has had no new injury to the knee.  Patient is diabetic is poorly controlled hemoglobin A1c was 8.5 blood glucose on 07/30/2019 was 511.  He is not interested in any type of knee replacement.  He is interested in possible supplemental injection in the knee.  Takes tramadol for the knee pain.  He also has chronic kidney disease is unable to take NSAIDs.  Has a history of stent placements on chronic Plavix and aspirin.  Review of Systems No fevers , chills. See HPI  Objective: Vital  Signs: There were no vitals taken for this visit.  Physical Exam Constitutional:      Appearance: He is not ill-appearing or diaphoretic.  Pulmonary:     Effort: Pulmonary effort is normal.  Neurological:     Mental Status: He is alert and oriented to person, place, and time.  Psychiatric:        Mood and Affect: Mood normal.        Thought Content: Thought content normal.     Ortho Exam Left knee good range of motion without pain.  Nontender over medial lateral joint line.  No instability valgus varus stressing.  Right knee slight flexion contracture.  No abnormal warmth erythema.  Slight effusion.  No instability valgus varus stressing.  Significant crepitus with passive range of motion of the right knee.  Tenderness along the lateral joint line on exam.  Ambulates without any assistive device.  Chronic venous changes bilateral lower extremities.  No pitting edema.  Calves are supple and nontender Specialty Comments:  No specialty comments available.  Imaging: XR Knee 1-2 Views Right  Result Date: 08/19/2019 AP lateral views right knee shows tricompartmental arthritic changes with bone-on-bone medial compartment and severe patellofemoral changes.  Moderate arthritic changes in the lateral compartment.  No  acute fractures.  Knee is well located.    PMFS History: Patient Active Problem List   Diagnosis Date Noted  . Type 2 diabetes mellitus without complication, with long-term current use of insulin ([facility]) 07/31/2019  . 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 Chronic systolic CHF (congestive heart failure) ([place] [facility]) 07/23/2019  . Dyspnea 07/23/2019  . Fatigue 07/23/2019  . Coronary artery disease involving native coronary artery of native heart without angina pectoris 07/23/2019  . PAD (peripheral artery disease) ([facility]) 07/23/2019  . Mixed hyperlipidemia 07/23/2019  . Essential hypertension 07/23/2019   Past Medical History:  Diagnosis Date  . CAD (coronary artery disease)    Reported  infarct and subsequent stent intervention approximately 15 years ago - [place]  . Chronic systolic heart failure ([facility])   . Essential hypertension   . Hyperlipidemia   . Obesity (BMI 30-39.9)   . PAD (peripheral artery disease) ([facility])    Previous stent revascularization, left leg - [place]  . Type 2 diabetes mellitus ([facility])     Family History  Problem Relation Age of Onset  . Hypertension Father     Past Surgical History:  Procedure Laterality Date  . CORONARY ANGIOPLASTY WITH STENT PLACEMENT     When he lived in [place], multiple stents placed  . KNEE SURGERY Right    Social History   Occupational History  . Not on file  Tobacco Use  . Smoking status: Former Smoker    Types: Cigarettes    Quit date: 07/23/1979    Years since quitting: 40.1  . Smokeless tobacco: Current User    Types: Snuff  Substance and Sexual Activity  . Alcohol use: Never  . Drug use: Never  . Sexual activity: Not on file

## 2019-08-19 NOTE — Telephone Encounter (Signed)
error 

## 2019-08-19 NOTE — Telephone Encounter (Signed)
Right knee supplemental injection

## 2019-08-21 ENCOUNTER — Telehealth: Payer: Self-pay

## 2019-08-21 NOTE — Telephone Encounter (Signed)
Approved for Monovisc-Right knee Dr. Margarito Liner and [facility] No copay 20% OOP No prior auth required   New start

## 2019-08-22 ENCOUNTER — Other Ambulatory Visit: Payer: Self-pay

## 2019-08-22 ENCOUNTER — Ambulatory Visit (INDEPENDENT_AMBULATORY_CARE_PROVIDER_SITE_OTHER): Payer: Medicare Other | Admitting: Cardiology

## 2019-08-22 ENCOUNTER — Encounter (INDEPENDENT_AMBULATORY_CARE_PROVIDER_SITE_OTHER): Payer: Self-pay

## 2019-08-22 ENCOUNTER — Encounter: Payer: Self-pay | Admitting: Cardiology

## 2019-08-22 VITALS — BP 124/80 | HR 66 | Ht 65.0 in | Wt 209.0 lb

## 2019-08-22 DIAGNOSIS — E782 Mixed hyperlipidemia: Secondary | ICD-10-CM

## 2019-08-22 DIAGNOSIS — I5032 Chronic diastolic (congestive) heart failure: Secondary | ICD-10-CM

## 2019-08-22 DIAGNOSIS — E669 Obesity, unspecified: Secondary | ICD-10-CM

## 2019-08-22 DIAGNOSIS — E119 Type 2 diabetes mellitus without complications: Secondary | ICD-10-CM

## 2019-08-22 DIAGNOSIS — Z794 Long term (current) use of insulin: Secondary | ICD-10-CM

## 2019-08-22 DIAGNOSIS — I472 Ventricular tachycardia: Secondary | ICD-10-CM

## 2019-08-22 DIAGNOSIS — I493 Ventricular premature depolarization: Secondary | ICD-10-CM

## 2019-08-22 DIAGNOSIS — I4729 Other ventricular tachycardia: Secondary | ICD-10-CM

## 2019-08-22 DIAGNOSIS — I2510 Atherosclerotic heart disease of native coronary artery without angina pectoris: Secondary | ICD-10-CM

## 2019-08-22 HISTORY — DX: Chronic diastolic (congestive) heart failure: I50.32

## 2019-08-22 HISTORY — DX: Ventricular tachycardia: I47.2

## 2019-08-22 HISTORY — DX: Other ventricular tachycardia: I47.29

## 2019-08-22 LAB — HEPATIC FUNCTION PANEL
ALT: 14 IU/L (ref 0–44)
AST: 19 IU/L (ref 0–40)
Albumin: 3.8 g/dL (ref 3.7–4.7)
Alkaline Phosphatase: 151 IU/L — ABNORMAL HIGH (ref 39–117)
Bilirubin Total: 0.5 mg/dL (ref 0.0–1.2)
Bilirubin, Direct: 0.22 mg/dL (ref 0.00–0.40)
Total Protein: 6.8 g/dL (ref 6.0–8.5)

## 2019-08-22 LAB — BASIC METABOLIC PANEL
BUN/Creatinine Ratio: 16 (ref 10–24)
BUN: 28 mg/dL — ABNORMAL HIGH (ref 8–27)
CO2: 25 mmol/L (ref 20–29)
Calcium: 9.1 mg/dL (ref 8.6–10.2)
Chloride: 102 mmol/L (ref 96–106)
Creatinine, Ser: 1.72 mg/dL — ABNORMAL HIGH (ref 0.76–1.27)
GFR calc Af Amer: 43 mL/min/{1.73_m2} — ABNORMAL LOW (ref >59)
GFR calc non Af Amer: 38 mL/min/{1.73_m2} — ABNORMAL LOW (ref >59)
Glucose: 266 mg/dL — ABNORMAL HIGH (ref 65–99)
Potassium: 4.6 mmol/L (ref 3.5–5.2)
Sodium: 140 mmol/L (ref 134–144)

## 2019-08-22 LAB — MAGNESIUM: Magnesium: 2.2 mg/dL (ref 1.6–2.3)

## 2019-08-22 MED ORDER — FUROSEMIDE 40 MG PO TABS: 40.0000 mg | ORAL_TABLET | Freq: Every day | ORAL | 3 refills | Status: DC

## 2019-08-22 MED ORDER — AMIODARONE HCL 200 MG PO TABS: 200.0000 mg | ORAL_TABLET | Freq: Every day | ORAL | 3 refills | Status: DC

## 2019-08-22 MED ORDER — POTASSIUM CHLORIDE CRYS ER 20 MEQ PO TBCR: 20.0000 meq | EXTENDED_RELEASE_TABLET | Freq: Every day | ORAL | 6 refills | Status: DC

## 2019-08-22 NOTE — Patient Instructions (Addendum)
Medication Instructions:   START TAKING:  LASIX 40 MG ONCE A DAY    POTASSIUM 20 MEQ ONCE AS DAY   AMIODARONE 200 MG ONCE A DAY    *If you need a refill on your cardiac medications before your next appointment, please call your pharmacy*   Lab Work:  BMT  MAG AND LFT TODAY   If you have labs (blood work) drawn today and your tests are completely normal, you will receive your results only by: Marland Kitchen MyChart Message (if you have MyChart) OR . A paper copy in the mail If you have any lab test that is abnormal or we need to change your treatment, we will call you to review the results.   Testing/Procedures: NONE ORDERED  TODAY   Follow-Up: At [facility], you and your health needs are our priority.  As part of our continuing mission to provide you with exceptional heart care, we have created designated Provider Care Teams.  These Care Teams include your primary Cardiologist (physician) and Advanced Practice Providers (APPs -  Physician Assistants and Nurse Practitioners) who all work together to provide you with the care you need, when you need it.  We recommend signing up for the patient portal called "MyChart".  Sign up information is provided on this After Visit Summary.  MyChart is used to connect with patients for Virtual Visits (Telemedicine).  Patients are able to view lab/test results, encounter notes, upcoming appointments, etc.  Non-urgent messages can be sent to your provider as well.   To learn more about what you can do with MyChart, go to [email or web address].    Your next appointment:   1 month(s)  The format for your next appointment:   In Person  Provider:   You will see Berniece Salines, DO.  Or, you can be scheduled with the following Advanced Practice Provider on your designated Care Team (at our [facility]):  Laurann Montana, FNP     Other Instructions

## 2019-08-22 NOTE — Telephone Encounter (Signed)
Lvm for pt to call back to schedule  OK to schedule @ next available

## 2019-08-22 NOTE — Progress Notes (Signed)
Cardiology Office Note:    Date:  08/22/2019   ID:  Oscar Kim, DOB 21-Nov-1940, MRN [id number]  PCP:  [name], NP  Cardiologist:  Berniece Salines, DO  Electrophysiologist:  None   Referring MD: [name], NP   Chief Complaint  Patient presents with  . Follow-up    History of Present Illness:    Oscar Kim is a 79 y.o. 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  I saw the patient on 07/23/2019 after he presented to prescribed vascular care due to his moved from [place] to [place].  At that time he reported that he was experiencing significant shortness of breath which is suspected was multifactorial.  I did increase his Lasix to 60 mg daily and 40 mg in the p.m. he did get blood work.   Interim his lab work came back reporting significant BNP therefore I gave the patient metolazone to help with active diuresis.  Days later the patient son called reporting that he was experiencing shortness of breath.  Therefore patient was sent to the emergency department, he spent a few days at the hospital where he was treated for heart failure as well as acute kidney injury.  Since hospitalization he tells me that he has been doing well.  Was not sure if he needed to continue to take his diuretics and had not been taking his medication.  His shortness of breath has improved he does not have any chest pain he has some rare intermittent palpitations.  No other complaints at this time.   Past Medical History:  Diagnosis Date  . CAD (coronary artery disease)    Reported infarct and subsequent stent intervention approximately 15 years ago - [place]  . Chronic systolic heart failure ([place])   . Essential hypertension   . Hyperlipidemia   . Obesity  (BMI 30-39.9)   . PAD (peripheral artery disease) (H[facility])    Previous stent revascularization, left leg - [place]  . Type 2 diabetes mellitus ([place])     Past Surgical History:  Procedure Laterality Date  . CORONARY ANGIOPLASTY WITH STENT PLACEMENT     When he lived in [place], multiple stents placed  . KNEE SURGERY Right     Current Medications: Current Meds  Medication Sig  . allopurinol (ZYLOPRIM) 100 MG tablet Take 100 mg by mouth 2 (two) times daily.  Marland Kitchen aspirin EC 81 MG tablet Take 81 mg by mouth daily.  . carvedilol (COREG) 6.25 MG tablet Take 0.5 tablets (3.125 mg total) by mouth 2 (two) times daily with a meal.  . clopidogrel (PLAVIX) 75 MG tablet Take 75 mg by mouth daily.  . furosemide (LASIX) 40 MG tablet Take 1 tablet (40 mg total) by mouth daily.  Marland Kitchen gabapentin (NEURONTIN) 300 MG capsule Take 300 mg by mouth 3 (three) times daily.  . insulin NPH-regular Human (70-30) 100 UNIT/ML injection Inject 34 Units into the skin 2 (two) times daily with a meal.   . pantoprazole (PROTONIX) 40 MG tablet Take 1 tablet (40 mg total) by mouth daily.  . potassium chloride SA (KLOR-CON) 20 MEQ tablet Take 1 tablet (20 mEq total) by mouth daily.  . traMADol (ULTRAM) 50 MG tablet Take 50 mg by mouth in the morning, at noon, and at bedtime.  . [  DISCONTINUED] furosemide (LASIX) 40 MG tablet Take 1 tablet (40 mg total) by mouth 2 (two) times daily.  . [DISCONTINUED] potassium chloride SA (KLOR-CON) 20 MEQ tablet Take 1 tablet (20 mEq total) by mouth 2 (two) times daily.     Allergies:   Patient has no known allergies.   Social History   Socioeconomic History  . Marital status: Married    Spouse name: Not on file  . Number of children: Not on file  . Years of education: Not on file  . Highest education level: Not on file  Occupational History  . Not on file  Tobacco Use  . Smoking status: Former Smoker    Types: Cigarettes    Quit date: 07/23/1979    Years since quitting:  40.1  . Smokeless tobacco: Current User    Types: Snuff  Substance and Sexual Activity  . Alcohol use: Never  . Drug use: Never  . Sexual activity: Not on file  Other Topics Concern  . Not on file  Social History Narrative  . Not on file   Social Determinants of Health   Financial Resource Strain:   . Difficulty of Paying Living Expenses:   Food Insecurity:   . Worried About Charity fundraiser in the Last Year:   . Arboriculturist in the Last Year:   Transportation Needs:   . Film/video editor (Medical):   Marland Kitchen Lack of Transportation (Non-Medical):   Physical Activity:   . Days of Exercise per Week:   . Minutes of Exercise per Session:   Stress:   . Feeling of Stress :   Social Connections:   . Frequency of Communication with Friends and Family:   . Frequency of Social Gatherings with Friends and Family:   . Attends Religious Services:   . Active Member of Clubs or Organizations:   . Attends Archivist Meetings:   Marland Kitchen Marital Status:      Family History: The patient's family history includes Hypertension in his father.  ROS:   Review of Systems  Constitution: Negative for decreased appetite, fever and weight gain.  HENT: Negative for congestion, ear discharge, hoarse voice and sore throat.   Eyes: Negative for discharge, redness, vision loss in right eye and visual halos.  Cardiovascular: Negative for chest pain, dyspnea on exertion, leg swelling, orthopnea and palpitations.  Respiratory: Negative for cough, hemoptysis, shortness of breath and snoring.   Endocrine: Negative for heat intolerance and polyphagia.  Hematologic/Lymphatic: Negative for bleeding problem. Does not bruise/bleed easily.  Skin: Negative for flushing, nail changes, rash and suspicious lesions.  Musculoskeletal: Negative for arthritis, joint pain, muscle cramps, myalgias, neck pain and stiffness.  Gastrointestinal: Negative for abdominal pain, bowel incontinence, diarrhea and excessive  appetite.  Genitourinary: Negative for decreased libido, genital sores and incomplete emptying.  Neurological: Negative for brief paralysis, focal weakness, headaches and loss of balance.  Psychiatric/Behavioral: Negative for altered mental status, depression and suicidal ideas.  Allergic/Immunologic: Negative for HIV exposure and persistent infections.    EKGs/Labs/Other Studies Reviewed:    The following studies were reviewed today:   EKG:  None today   TTE IMPRESSIONS 07/30/2019 1. Limited apicals due to patient not cooperating . Left ventricular  ejection fraction, by estimation, is 60 to 65%. The left ventricle has  normal function. The left ventricle has no regional wall motion  abnormalities. There is moderate left ventricular  hypertrophy. Left ventricular diastolic parameters are indeterminate.  2. Right ventricular systolic function  is normal. The right ventricular  size is normal.  3. Left atrial size was mildly dilated.  4. The mitral valve is degenerative. Mild mitral valve regurgitation. No  evidence of mitral stenosis.  5. The aortic valve is tricuspid. Aortic valve regurgitation is trivial.  Mild aortic valve sclerosis is present, with no evidence of aortic valve  stenosis.  6. The inferior vena cava is normal in size with greater than 50%  respiratory variability, suggesting right atrial pressure of 3 mmHg.   Zio Monitor  The patient wore the monitor for 2 days 1 hours starting 07/23/2019. Indication: Premature ventricular complexes  The minimum heart rate was 53 bpm, maximum heart rate was 200 bpm, and average heart rate was 79 bpm. Predominant underlying rhythm was Sinus Rhythm. Slight P wave morphology changes were noted.   165 Ventricular Tachycardia runs occurred, the run with the fastest interval lasting 4 beats with a maximum rate of 200 bpm, the longest lasting 13 beats with an average rate of 104 bpm.   43 Supraventricular Tachycardia runs  occurred, the run with the fastest interval lasting 4 beats with a maximum rate of 182 bpm, the longest lasting 31.2 seconds with an average rate of 101 bpm.  Premature atrial complexes were occasional (2.6%, 6322). Premature Ventricular complexes were frequent (31.6%, [id number]). VE Couplets were occasional (1.9%, 2283), and VE Triplets were rare (<1.0%, 194). Ventricular Bigeminy and Trigeminy were present.  No pauses, No AV block and no atrial fibrillation present. No patient triggered events and diary events noted.    Conclusion: This study is remarkable for the following:                             1. 165 episodes of Nonsustained ventricular tachycardia.                             2. Frequent Premature ventricular complexes (31.6%, 75803).                             3. 43 Paroxysmal supraventricular tachycardia which may be atrial tachycardia.                             4.  Occasional premature atrial complex.     Recent Labs: 07/23/2019: Magnesium 1.8; NT-Pro BNP 1,962; TSH 2.270 07/29/2019: B Natriuretic Peptide 65.0 07/30/2019: ALT 17 07/31/2019: BUN 33; Creatinine, Ser 1.84; Hemoglobin 11.9; Platelets 275; Potassium 3.9; Sodium 136   Recent Lipid Panel    Component Value Date/Time   CHOL 237 (H) 07/31/2019 0256   TRIG 192 (H) 07/31/2019 0256   HDL 31 (L) 07/31/2019 0256   CHOLHDL 7.6 07/31/2019 0256   VLDL 38 07/31/2019 0256   LDLCALC 168 (H) 07/31/2019 0256    Physical Exam:    VS:  BP 124/80 (BP Location: Right Arm, Patient Position: Sitting, Cuff Size: Normal)   Pulse 66   Ht 5\' 5"  (1.651 m)   Wt 209 lb (94.8 kg)   SpO2 99%   BMI 34.78 kg/m     Wt Readings from Last 3 Encounters:  08/22/19 209 lb (94.8 kg)  07/31/19 197 lb 3.2 oz (89.4 kg)  07/23/19 212 lb (96.2 kg)     GEN: Well nourished, well developed in no acute distress HEENT: Normal NECK: No  JVD; No carotid bruits LYMPHATICS: No lymphadenopathy CARDIAC: S1S2 noted,RRR, no murmurs, rubs,  gallops RESPIRATORY:  Clear to auscultation without rales, wheezing or rhonchi  ABDOMEN: Soft, non-tender, non-distended, +bowel sounds, no guarding. EXTREMITIES: bilateral +1 leg edema, No cyanosis, no clubbing MUSCULOSKELETAL:  No deformity  SKIN: Warm and dry NEUROLOGIC:  Alert and oriented x 3, non-focal PSYCHIATRIC:  Normal affect, good insight  ASSESSMENT:    1. NSVT (nonsustained ventricular tachycardia) ([place])   2. Frequent PVCs   3. Chronic diastolic heart failure ([place])   4. Coronary artery disease involving native coronary artery of native heart without angina pectoris   5. Insulin-requiring or dependent type II diabetes mellitus ([place])   6. Obesity (BMI 30-39.9)   7. Mixed hyperlipidemia    PLAN:    I am going to start the patient on amiodarone for his episodes of NSVT as well as frequent PVCs.  I discussed with the patient and his wife today about this medication.  His questions were answered.  He is willing to proceed with starting the new medication.  Today he has bilateral leg edema he has not been taking his Lasix I am going to give patient cautious diuretics with Lasix 20 mg daily he is going to also take potassium supplement 20 mg daily along with this.  Has no anginal symptoms today we will continue the patient on his dual antiplatelet therapy.  He has had significant statin intolerance.  Talk to him today About PCSK9 him.  He is going to think about this.  Hyperlipidemia-as noted above with significant statin intolerance.  We will discuss again about his kidneys inhibitors.  Diabetes mellitus per his primary doctor.  Obesity-the patient understands the need to lose weight with diet and exercise. We have discussed specific strategies for this.  The patient is in agreement with the above plan. The patient left the office in stable condition.  The patient will follow up in 1 month.   Medication Adjustments/Labs and Tests Ordered: Current medicines are reviewed at  length with the patient today.  Concerns regarding medicines are outlined above.  Orders Placed This Encounter  Procedures  . Basic metabolic panel  . Magnesium  . Hepatic function panel   Meds ordered this encounter  Medications  . amiodarone (PACERONE) 200 MG tablet    Sig: Take 1 tablet (200 mg total) by mouth daily.    Dispense:  90 tablet    Refill:  3  . furosemide (LASIX) 40 MG tablet    Sig: Take 1 tablet (40 mg total) by mouth daily.    Dispense:  90 tablet    Refill:  3  . potassium chloride SA (KLOR-CON) 20 MEQ tablet    Sig: Take 1 tablet (20 mEq total) by mouth daily.    Dispense:  60 tablet    Refill:  6    Patient Instructions  Medication Instructions:   START TAKING:  LASIX 40 MG ONCE A DAY    POTASSIUM 20 MEQ ONCE AS DAY   AMIODARONE 200 MG ONCE A DAY    *If you need a refill on your cardiac medications before your next appointment, please call your pharmacy*   Lab Work:  BMT  MAG AND LFT TODAY   If you have labs (blood work) drawn today and your tests are completely normal, you will receive your results only by: Marland Kitchen MyChart Message (if you have MyChart) OR . A paper copy in the mail If you have any lab test that  is abnormal or we need to change your treatment, we will call you to review the results.   Testing/Procedures: NONE ORDERED  TODAY   Follow-Up: At [facility], you and your health needs are our priority.  As part of our continuing mission to provide you with exceptional heart care, we have created designated Provider Care Teams.  These Care Teams include your primary Cardiologist (physician) and Advanced Practice Providers (APPs -  Physician Assistants and Nurse Practitioners) who all work together to provide you with the care you need, when you need it.  We recommend signing up for the patient portal called "MyChart".  Sign up information is provided on this After Visit Summary.  MyChart is used to connect with patients for Virtual  Visits (Telemedicine).  Patients are able to view lab/test results, encounter notes, upcoming appointments, etc.  Non-urgent messages can be sent to your provider as well.   To learn more about what you can do with MyChart, go to [email or web address].    Your next appointment:   1 month(s)  The format for your next appointment:   In Person  Provider:   You will see Berniece Salines, DO.  Or, you can be scheduled with the following Advanced Practice Provider on your designated Care Team (at our [facility]):  Laurann Montana, FNP     Other Instructions      Adopting a Healthy Lifestyle.  Know what a healthy weight is for you (roughly BMI <25) and aim to maintain this   Aim for 7+ servings of fruits and vegetables daily   65-80+ fluid ounces of water or unsweet tea for healthy kidneys   Limit to max 1 drink of alcohol per day; avoid smoking/tobacco   Limit animal fats in diet for cholesterol and heart health - choose grass fed whenever available   Avoid highly processed foods, and foods high in saturated/trans fats   Aim for low stress - take time to unwind and care for your mental health   Aim for 150 min of moderate intensity exercise weekly for heart health, and weights twice weekly for bone health   Aim for 7-9 hours of sleep daily   When it comes to diets, agreement about the perfect plan isnt easy to find, even among the experts. Experts at the [employer] developed an idea known as the Healthy Eating Plate. Just imagine a plate divided into logical, healthy portions.   The emphasis is on diet quality:   Load up on vegetables and fruits - one-half of your plate: Aim for color and variety, and remember that potatoes dont count.   Go for whole grains - one-quarter of your plate: Whole wheat, barley, wheat berries, quinoa, oats, brown rice, and foods made with them. If you want pasta, go with whole wheat pasta.   Protein power - one-quarter  of your plate: Fish, chicken, beans, and nuts are all healthy, versatile protein sources. Limit red meat.   The diet, however, does go beyond the plate, offering a few other suggestions.   Use healthy plant oils, such as olive, canola, soy, corn, sunflower and peanut. Check the labels, and avoid partially hydrogenated oil, which have unhealthy trans fats.   If youre thirsty, drink water. Coffee and tea are good in moderation, but skip sugary drinks and limit milk and dairy products to one or two daily servings.   The type of carbohydrate in the diet is more important than the amount. Some sources of  carbohydrates, such as vegetables, fruits, whole grains, and beans-are healthier than others.   Finally, stay active  Signed, Berniece Salines, DO  08/22/2019 1:55 PM    [facility]

## 2019-08-25 ENCOUNTER — Telehealth: Payer: Self-pay

## 2019-08-25 NOTE — Telephone Encounter (Signed)
-----   Message from Berniece Salines, DO sent at 08/23/2019 11:50 AM EDT ----- Cr improved 1.72, Blood glucose elevated.

## 2019-08-25 NOTE — Telephone Encounter (Signed)
I tried calling the patient, but phone does not ring. 4/19

## 2019-08-26 ENCOUNTER — Telehealth: Payer: Self-pay

## 2019-08-26 NOTE — Telephone Encounter (Signed)
Spoke with patient regarding results.  Patient verbalizes understanding and is agreeable to plan of care. Advised patient to call back with any issues or concerns.  

## 2019-08-26 NOTE — Telephone Encounter (Signed)
-----   Message from Berniece Salines, DO sent at 08/23/2019 11:50 AM EDT ----- Cr improved 1.72, Blood glucose elevated.

## 2019-08-27 ENCOUNTER — Encounter: Payer: Self-pay | Admitting: Orthopaedic Surgery

## 2019-08-27 ENCOUNTER — Ambulatory Visit (INDEPENDENT_AMBULATORY_CARE_PROVIDER_SITE_OTHER): Payer: Medicare Other | Admitting: Orthopaedic Surgery

## 2019-08-27 ENCOUNTER — Other Ambulatory Visit: Payer: Self-pay

## 2019-08-27 DIAGNOSIS — M1711 Unilateral primary osteoarthritis, right knee: Secondary | ICD-10-CM

## 2019-08-27 MED ORDER — HYALURONAN 88 MG/4ML IX SOSY
88.0000 mg | PREFILLED_SYRINGE | INTRA_ARTICULAR | Status: AC | PRN
  Administered 2019-08-27: 88 mg via INTRA_ARTICULAR

## 2019-08-27 NOTE — Progress Notes (Signed)
   Procedure Note  Patient: Oscar Kim             Date of Birth: 10/21/1940           MRN: [id number]             Visit Date: 08/27/2019  Procedures: Visit Diagnoses:  1. Primary osteoarthritis of right knee     Large Joint Inj: R knee on 08/27/2019 9:21 AM Indications: diagnostic evaluation and pain Details: 22 G 1.5 in needle, superolateral approach  Arthrogram: No  Medications: 88 mg Hyaluronan 88 MG/4ML Outcome: tolerated well, no immediate complications Procedure, treatment alternatives, risks and benefits explained, specific risks discussed. Consent was given by the patient. Immediately prior to procedure a time out was called to verify the correct patient, procedure, equipment, support staff and site/side marked as required. Patient was prepped and draped in the usual sterile fashion.    The patient is a very pleasant 79 year old gentleman is here today for scheduled hyaluronic acid injection with Monovisc in his right knee to treat the pain from osteoarthritis.  He has tried and failed other forms of conservative treatment including steroid injections.  His right knee pain is daily.  He has known osteoarthritis that is well documented of the right knee.  On exam today there is a slight flexion contracture and good flexion and lacking full extension by about 3 degrees.  There is varus malalignment as well.  There is a mild effusion.  I did place Monovisc in the knee without difficulty.  He understands fully why we are trying this.  I described the risk and benefits of this as well.  At this point follow-up will be as needed with the understanding that if his knee worsens he will let us know.  He can always have a steroid injection again in 3 months from now if needed or even a hyaluronic acid injection again in 6 months.

## 2019-08-29 ENCOUNTER — Ambulatory Visit (INDEPENDENT_AMBULATORY_CARE_PROVIDER_SITE_OTHER): Payer: Medicare Other

## 2019-08-29 ENCOUNTER — Other Ambulatory Visit: Payer: Self-pay

## 2019-08-29 DIAGNOSIS — R0602 Shortness of breath: Secondary | ICD-10-CM | POA: Diagnosis not present

## 2019-08-29 DIAGNOSIS — I493 Ventricular premature depolarization: Secondary | ICD-10-CM | POA: Diagnosis not present

## 2019-08-29 DIAGNOSIS — I509 Heart failure, unspecified: Secondary | ICD-10-CM | POA: Diagnosis not present

## 2019-08-29 NOTE — Progress Notes (Signed)
Complete echocardiogram performed.  Jimmy Onix Jumper RDCS, RVT  

## 2019-09-01 ENCOUNTER — Other Ambulatory Visit: Payer: Medicare Other

## 2019-09-05 ENCOUNTER — Telehealth: Payer: Self-pay | Admitting: Cardiology

## 2019-09-05 NOTE — Telephone Encounter (Signed)
Patient's son calling for echo results.

## 2019-09-05 NOTE — Telephone Encounter (Signed)
Called and spoke with pt, notified that Echo has not been resulted yet and as soon as it is we would let him know. Pt very upset. States he has called 3 times for these results and has heard nothing yet. Pt asking when he will get results. Notified I was unaware, not sure if he would get them today, but hopefully by Monday he would have the results. Pt still upset states just to call him Monday because he does not want to hear from the office today.  No other questions at this time.

## 2019-09-05 NOTE — Telephone Encounter (Signed)
   Pt calling back to follow up and wanted to know echo results, he said he can be reached at son's phone #  Please call

## 2019-09-09 NOTE — Telephone Encounter (Signed)
Spoke with patient regarding results and recommendation.  Patient verbalizes understanding and is agreeable to plan of care. Advised patient to call back with any issues or concerns.  

## 2019-09-09 NOTE — Telephone Encounter (Signed)
Please let the patient know that I was waiting to discuss his echo with him in person since I saw that his visit was on 5/14 so close. I apologize if he was upset.  His echo showed a normal ejection fraction, with diastolic dysfunction which means the heart does not relax fully well.

## 2019-09-10 ENCOUNTER — Other Ambulatory Visit: Payer: Self-pay | Admitting: Sports Medicine

## 2019-09-10 ENCOUNTER — Other Ambulatory Visit: Payer: Self-pay

## 2019-09-10 ENCOUNTER — Ambulatory Visit (INDEPENDENT_AMBULATORY_CARE_PROVIDER_SITE_OTHER): Payer: Medicare Other | Admitting: Sports Medicine

## 2019-09-10 ENCOUNTER — Encounter: Payer: Self-pay | Admitting: Sports Medicine

## 2019-09-10 DIAGNOSIS — T148XXA Other injury of unspecified body region, initial encounter: Secondary | ICD-10-CM

## 2019-09-10 DIAGNOSIS — I739 Peripheral vascular disease, unspecified: Secondary | ICD-10-CM | POA: Diagnosis not present

## 2019-09-10 DIAGNOSIS — M79674 Pain in right toe(s): Secondary | ICD-10-CM | POA: Diagnosis not present

## 2019-09-10 DIAGNOSIS — E119 Type 2 diabetes mellitus without complications: Secondary | ICD-10-CM | POA: Diagnosis not present

## 2019-09-10 DIAGNOSIS — M79675 Pain in left toe(s): Secondary | ICD-10-CM | POA: Diagnosis not present

## 2019-09-10 DIAGNOSIS — I2510 Atherosclerotic heart disease of native coronary artery without angina pectoris: Secondary | ICD-10-CM | POA: Diagnosis not present

## 2019-09-10 DIAGNOSIS — B351 Tinea unguium: Secondary | ICD-10-CM

## 2019-09-10 DIAGNOSIS — S90811A Abrasion, right foot, initial encounter: Secondary | ICD-10-CM

## 2019-09-10 DIAGNOSIS — Z794 Long term (current) use of insulin: Secondary | ICD-10-CM

## 2019-09-10 NOTE — Progress Notes (Signed)
Subjective: Oscar Kim is a 79 y.o. male patient with history of diabetes who presents to office today complaining of long,mildly painful nails  while ambulating in shoes; unable to trim. Patient states that the glucose reading this morning was 189 mg/dl. Last A1C per patient report 8 or 9. Patient denies any new changes in medication or new problems except noticing dark scrapes to toes on right. Patient denies any new cramping, numbness, burning or tingling in the legs. Admits that his feet are always numb.States that he just moved to [place] and wants to establish care since he can not trim his own nails.  Review of Systems  All other systems reviewed and are negative.    Patient Active Problem List   Diagnosis Date Noted  . NSVT (nonsustained ventricular tachycardia) ([place]) 08/22/2019  . Chronic diastolic heart failure ([place]) 08/22/2019  . Type 2 diabetes mellitus without complication, with long-term current use of insulin ([place]) 07/31/2019  . 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 Chronic systolic CHF (congestive heart failure) ([place]) 07/23/2019  . Dyspnea 07/23/2019  . Fatigue 07/23/2019  . Coronary artery disease involving native coronary artery of native heart without angina pectoris 07/23/2019  . PAD (peripheral artery disease) ([place]) 07/23/2019  . Mixed hyperlipidemia 07/23/2019  . Essential hypertension 07/23/2019   Current Outpatient Medications on File Prior to Visit  Medication Sig Dispense Refill  . allopurinol (ZYLOPRIM) 100 MG tablet Take 100 mg by mouth 2 (two) times daily.    Marland Kitchen amiodarone (PACERONE) 200 MG tablet Take 1 tablet (200 mg total) by mouth daily. 90 tablet 3  . aspirin EC 81 MG tablet Take 81 mg by mouth daily.    . carvedilol (COREG) 6.25 MG tablet Take 0.5 tablets (3.125 mg total) by mouth 2 (two) times daily with a meal. 60 tablet 2  . clopidogrel (PLAVIX) 75 MG tablet Take 75 mg by mouth daily.    . furosemide (LASIX) 40 MG tablet Take 1 tablet (40 mg total) by mouth  daily. 90 tablet 3  . gabapentin (NEURONTIN) 300 MG capsule Take 300 mg by mouth 3 (three) times daily.    . insulin NPH-regular Human (70-30) 100 UNIT/ML injection Inject 34 Units into the skin 2 (two) times daily with a meal.     . pantoprazole (PROTONIX) 40 MG tablet Take 1 tablet (40 mg total) by mouth daily. 30 tablet 1  . potassium chloride SA (KLOR-CON) 20 MEQ tablet Take 1 tablet (20 mEq total) by mouth daily. 60 tablet 6  . traMADol (ULTRAM) 50 MG tablet Take 50 mg by mouth in the morning, at noon, and at bedtime.     No current facility-administered medications on file prior to visit.   No Known Allergies  Recent Results (from the past 2160 hour(s))  Basic metabolic panel     Status: Abnormal   Collection Time: 07/23/19 11:38 AM  Result Value Ref Range   Glucose 230 (H) 65 - 99 mg/dL   BUN 12 8 - 27 mg/dL   Creatinine, Ser 1.22 0.76 - 1.27 mg/dL   GFR calc non Af Amer 57 (L) >59 mL/min/1.73   GFR calc Af Amer 66 >59 mL/min/1.73   BUN/Creatinine Ratio 10 10 - 24   Sodium 139 134 - 144 mmol/L   Potassium 3.4 (L) 3.5 - 5.2 mmol/L   Chloride 98 96 - 106 mmol/L   CO2 28 20 - 29 mmol/L   Calcium 8.5 (L) 8.6 - 10.2 mg/dL  Magnesium     Status:  None   Collection Time: 07/23/19 11:38 AM  Result Value Ref Range   Magnesium 1.8 1.6 - 2.3 mg/dL  CBC     Status: Abnormal   Collection Time: 07/23/19 11:38 AM  Result Value Ref Range   WBC 9.2 3.4 - 10.8 x10E3/uL   RBC 4.05 (L) 4.14 - 5.80 x10E6/uL   Hemoglobin 10.7 (L) 13.0 - 17.7 g/dL   Hematocrit 34.1 (L) 37.5 - 51.0 %   MCV 84 79 - 97 fL   MCH 26.4 (L) 26.6 - 33.0 pg   MCHC 31.4 (L) 31.5 - 35.7 g/dL   RDW 17.0 (H) 11.6 - 15.4 %   Platelets 222 150 - 450 x10E3/uL  TSH     Status: None   Collection Time: 07/23/19 11:38 AM  Result Value Ref Range   TSH 2.270 0.450 - 4.500 uIU/mL  Pro b natriuretic peptide (BNP)     Status: Abnormal   Collection Time: 07/23/19 11:38 AM  Result Value Ref Range   NT-Pro BNP 1,962 (H) 0 - 486  pg/mL    Comment: The following cut-points have been suggested for the use of proBNP for the diagnostic evaluation of heart failure (HF) in patients with acute dyspnea: Modality                     Age           Optimal Cut                            (years)            Point ------------------------------------------------------ Diagnosis (rule in HF)        <50            450 pg/mL                           50 - 75            900 pg/mL                               >75           1800 pg/mL Exclusion (rule out HF)  Age independent     300 pg/mL   Basic metabolic panel     Status: Abnormal   Collection Time: 07/29/19  3:31 PM  Result Value Ref Range   Sodium 133 (L) 135 - 145 mmol/L   Potassium 3.5 3.5 - 5.1 mmol/L   Chloride 88 (L) 98 - 111 mmol/L   CO2 31 22 - 32 mmol/L   Glucose, Bld 240 (H) 70 - 99 mg/dL    Comment: Glucose reference range applies only to samples taken after fasting for at least 8 hours.   BUN 31 (H) 8 - 23 mg/dL   Creatinine, Ser 1.70 (H) 0.61 - 1.24 mg/dL   Calcium 9.1 8.9 - 10.3 mg/dL   GFR calc non Af Amer 38 (L) >60 mL/min   GFR calc Af Amer 44 (L) >60 mL/min   Anion gap 14 5 - 15    Comment: Performed at [facility] [address], [place],  [address]  CBC     Status: Abnormal   Collection Time: 07/29/19  3:31 PM  Result Value Ref Range   WBC 11.3 (H) 4.0 - 10.5 K/uL  RBC 4.38 4.22 - 5.81 MIL/uL   Hemoglobin 11.5 (L) 13.0 - 17.0 g/dL   HCT 35.7 (L) 39.0 - 52.0 %   MCV 81.5 80.0 - 100.0 fL   MCH 26.3 26.0 - 34.0 pg   MCHC 32.2 30.0 - 36.0 g/dL   RDW 16.5 (H) 11.5 - 15.5 %   Platelets 295 150 - 400 K/uL   nRBC 0.0 0.0 - 0.2 %    Comment: Performed at [facility] [address] [place], [place] [address]  Brain natriuretic peptide     Status: None   Collection Time: 07/29/19  3:31 PM  Result Value Ref Range   B Natriuretic Peptide 65.0 0.0 - 100.0 pg/mL    Comment: Performed at [facility] [address],  [place], [place] [address]  SARS CORONAVIRUS 2 (TAT 6-24 HRS) Nasopharyngeal Nasopharyngeal Swab     Status: None   Collection Time: 07/30/19 12:19 AM   Specimen: Nasopharyngeal Swab  Result Value Ref Range   SARS Coronavirus 2 NEGATIVE NEGATIVE    Comment: (NOTE) SARS-CoV-2 target nucleic acids are NOT DETECTED. The SARS-CoV-2 RNA is generally detectable in upper and lower respiratory specimens during the acute phase of infection. Negative results do not preclude SARS-CoV-2 infection, do not rule out co-infections with other pathogens, and should not be used as the sole basis for treatment or other patient management decisions. Negative results must be combined with clinical observations, patient history, and epidemiological information. The expected result is Negative. Fact Sheet for Patients: SugarRoll.be Fact Sheet for Healthcare Providers: https://www.woods-mathews.com/ This test is not yet approved or cleared by the [place] FDA and  has been authorized for detection and/or diagnosis of SARS-CoV-2 by FDA under an Emergency Use Authorization (EUA). This EUA will remain  in effect (meaning this test can be used) for the duration of the COVID-19 declaration under Section 56 4(b)(1) of the Act, 21 U.S.C. section 360bbb-3(b)(1), unless the authorization is terminated or revoked sooner. Performed at [facility], [place] [address], [place], [place] [address]   Urinalysis, Complete w Microscopic     Status: Abnormal   Collection Time: 07/30/19 12:28 AM  Result Value Ref Range   Color, Urine YELLOW YELLOW   APPearance CLEAR CLEAR   Specific Gravity, Urine 1.011 1.005 - 1.030   pH 7.0 5.0 - 8.0   Glucose, UA 150 (A) NEGATIVE mg/dL   Hgb urine dipstick NEGATIVE NEGATIVE   Bilirubin Urine NEGATIVE NEGATIVE   Ketones, ur NEGATIVE NEGATIVE mg/dL   Protein, ur 30 (A) NEGATIVE mg/dL   Nitrite NEGATIVE NEGATIVE   Leukocytes,Ua TRACE (A)  NEGATIVE   RBC / HPF 0-5 0 - 5 RBC/hpf   WBC, UA 0-5 0 - 5 WBC/hpf   Bacteria, UA NONE SEEN NONE SEEN   Squamous Epithelial / LPF 0-5 0 - 5   Hyaline Casts, UA PRESENT     Comment: Performed at [facility] [address], [place], [place] [address]  Hepatic function panel     Status: Abnormal   Collection Time: 07/30/19  4:22 AM  Result Value Ref Range   Total Protein 7.6 6.5 - 8.1 g/dL   Albumin 3.4 (L) 3.5 - 5.0 g/dL   AST 35 15 - 41 U/L   ALT 17 0 - 44 U/L   Alkaline Phosphatase 143 (H) 38 - 126 U/L   Total Bilirubin 1.0 0.3 - 1.2 mg/dL   Bilirubin, Direct 0.2 0.0 - 0.2 mg/dL  Indirect Bilirubin 0.8 0.3 - 0.9 mg/dL    Comment: Performed at [facility], [place] [address] [place], [place] [address]  Hemoglobin A1c     Status: Abnormal   Collection Time: 07/30/19 10:02 AM  Result Value Ref Range   Hgb A1c MFr Bld 8.5 (H) 4.8 - 5.6 %    Comment: (NOTE) Pre diabetes:          5.7%-6.4% Diabetes:              >6.4% Glycemic control for   <7.0% adults with diabetes    Mean Plasma Glucose 197.25 mg/dL    Comment: Performed at [facility] [address] [place], [place] [address]  Basic metabolic panel     Status: Abnormal   Collection Time: 07/30/19 10:02 AM  Result Value Ref Range   Sodium 130 (L) 135 - 145 mmol/L   Potassium 4.2 3.5 - 5.1 mmol/L   Chloride 86 (L) 98 - 111 mmol/L   CO2 30 22 - 32 mmol/L   Glucose, Bld 511 (HH) 70 - 99 mg/dL    Comment: Glucose reference range applies only to samples taken after fasting for at least 8 hours. CRITICAL RESULT CALLED TO, READ BACK BY AND VERIFIED WITH: CASEY [facility] RN [phone] [id number] BY A BENNETT    BUN 32 (H) 8 - 23 mg/dL   Creatinine, Ser 1.76 (H) 0.61 - 1.24 mg/dL   Calcium 9.2 8.9 - 10.3 mg/dL   GFR calc non Af Amer 37 (L) >60 mL/min   GFR calc Af Amer 43 (L) >60 mL/min   Anion gap 14 5 - 15    Comment: Performed at [facility] [address] [place], [place] [address]  ECHOCARDIOGRAM COMPLETE      Status: None   Collection Time: 07/30/19 12:17 PM  Result Value Ref Range   Weight 3,171.2 oz   Height 65 in   BP 119/87 mmHg  Glucose, capillary     Status: Abnormal   Collection Time: 07/30/19 12:31 PM  Result Value Ref Range   Glucose-Capillary 430 (H) 70 - 99 mg/dL    Comment: Glucose reference range applies only to samples taken after fasting for at least 8 hours.  Glucose, capillary     Status: Abnormal   Collection Time: 07/30/19  4:13 PM  Result Value Ref Range   Glucose-Capillary 358 (H) 70 - 99 mg/dL    Comment: Glucose reference range applies only to samples taken after fasting for at least 8 hours.  Glucose, capillary     Status: Abnormal   Collection Time: 07/30/19 10:35 PM  Result Value Ref Range   Glucose-Capillary 164 (H) 70 - 99 mg/dL    Comment: Glucose reference range applies only to samples taken after fasting for at least 8 hours.  Basic metabolic panel     Status: Abnormal   Collection Time: 07/31/19  2:56 AM  Result Value Ref Range   Sodium 136 135 - 145 mmol/L   Potassium 3.9 3.5 - 5.1 mmol/L   Chloride 93 (L) 98 - 111 mmol/L   CO2 30 22 - 32 mmol/L   Glucose, Bld 123 (H) 70 - 99 mg/dL    Comment: Glucose reference range applies only to samples taken after fasting for at least 8 hours.   BUN 33 (H) 8 - 23 mg/dL   Creatinine, Ser 1.84 (H) 0.61 - 1.24 mg/dL   Calcium 9.3 8.9 - 10.3 mg/dL   GFR  calc non Af Amer 35 (L) >60 mL/min   GFR calc Af Amer 40 (L) >60 mL/min   Anion gap 13 5 - 15    Comment: Performed at [facility] [address], [place], [place] [address]  CBC     Status: Abnormal   Collection Time: 07/31/19  2:56 AM  Result Value Ref Range   WBC 10.1 4.0 - 10.5 K/uL   RBC 4.51 4.22 - 5.81 MIL/uL   Hemoglobin 11.9 (L) 13.0 - 17.0 g/dL   HCT 36.6 (L) 39.0 - 52.0 %   MCV 81.2 80.0 - 100.0 fL   MCH 26.4 26.0 - 34.0 pg   MCHC 32.5 30.0 - 36.0 g/dL   RDW 16.7 (H) 11.5 - 15.5 %   Platelets 275 150 - 400 K/uL   nRBC 0.0 0.0 - 0.2 %     Comment: Performed at [facility], [place] [address], [place], [place] [address]  Lipid panel     Status: Abnormal   Collection Time: 07/31/19  2:56 AM  Result Value Ref Range   Cholesterol 237 (H) 0 - 200 mg/dL   Triglycerides 192 (H) <150 mg/dL   HDL 31 (L) >40 mg/dL   Total CHOL/HDL Ratio 7.6 RATIO   VLDL 38 0 - 40 mg/dL   LDL Cholesterol 168 (H) 0 - 99 mg/dL    Comment:        Total Cholesterol/HDL:CHD Risk Coronary Heart Disease Risk Table                     Men   Women  1/2 Average Risk   3.4   3.3  Average Risk       5.0   4.4  2 X Average Risk   9.6   7.1  3 X Average Risk  23.4   11.0        Use the calculated Patient Ratio above and the CHD Risk Table to determine the patient's CHD Risk.        ATP III CLASSIFICATION (LDL):  <100     mg/dL   Optimal  100-129  mg/dL   Near or Above                    Optimal  130-159  mg/dL   Borderline  160-189  mg/dL   High  >190     mg/dL   Very High Performed at [facility] [address], [place], [place] [address]   Glucose, capillary     Status: Abnormal   Collection Time: 07/31/19  7:54 AM  Result Value Ref Range   Glucose-Capillary 167 (H) 70 - 99 mg/dL    Comment: Glucose reference range applies only to samples taken after fasting for at least 8 hours.   Comment 1 Notify RN    Comment 2 Document in Chart   Glucose, capillary     Status: Abnormal   Collection Time: 07/31/19 11:38 AM  Result Value Ref Range   Glucose-Capillary 290 (H) 70 - 99 mg/dL    Comment: Glucose reference range applies only to samples taken after fasting for at least 8 hours.   Comment 1 Notify RN    Comment 2 Document in Chart   Basic metabolic panel     Status: Abnormal   Collection Time: 08/22/19 11:43 AM  Result Value Ref Range   Glucose 266 (H) 65 - 99 mg/dL   BUN 28 (H)  8 - 27 mg/dL   Creatinine, Ser 1.72 (H) 0.76 - 1.27 mg/dL   GFR calc non Af Amer 38 (L) >59 mL/min/1.73   GFR calc Af Amer 43 (L) >59 mL/min/1.73    BUN/Creatinine Ratio 16 10 - 24   Sodium 140 134 - 144 mmol/L   Potassium 4.6 3.5 - 5.2 mmol/L   Chloride 102 96 - 106 mmol/L   CO2 25 20 - 29 mmol/L   Calcium 9.1 8.6 - 10.2 mg/dL  Magnesium     Status: None   Collection Time: 08/22/19 11:43 AM  Result Value Ref Range   Magnesium 2.2 1.6 - 2.3 mg/dL  Hepatic function panel     Status: Abnormal   Collection Time: 08/22/19 11:43 AM  Result Value Ref Range   Total Protein 6.8 6.0 - 8.5 g/dL   Albumin 3.8 3.7 - 4.7 g/dL   Bilirubin Total 0.5 0.0 - 1.2 mg/dL   Bilirubin, Direct 0.22 0.00 - 0.40 mg/dL   Alkaline Phosphatase 151 (H) 39 - 117 IU/L   AST 19 0 - 40 IU/L   ALT 14 0 - 44 IU/L    Objective: General: Patient is awake, alert, and oriented x 3 and in no acute distress.  Integument: Skin is warm, dry and supple bilateral. Nails are tender, long, thickened and dystrophic with subungual debris, consistent with onychomycosis, 1-5 bilateral. Dry blood/abrasions to toes 1,3,4 on right. No signs of infection. No open lesions or preulcerative lesions present bilateral. Remaining integument unremarkable.  Vasculature:  Dorsalis Pedis pulse 1/4 bilateral. Posterior Tibial pulse  0/4 bilateral. Capillary fill time <5 sec 1-5 bilateral. No hair growth to the level of the digits.Temperature gradient within normal limits.  Trophic skin changes present bilateral. 1+ pitting edema present bilateral.   Neurology: The patient has absent sensation measured with a 5.07/10g Semmes Weinstein Monofilament at all pedal sites bilateral . Vibratory sensation absent bilateral with tuning fork. No Babinski sign present bilateral.   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 Muscular strength 5/5 in all lower extremity muscular groups bilateral without pain on range of motion. No tenderness with calf compression bilateral.  Assessment and Plan: Problem List Items Addressed This Visit      Endocrine   Type 2 diabetes  mellitus without complication, with long-term current use of insulin ([place])    Other Visit Diagnoses    Pain due to onychomycosis of toenails of both feet    -  Primary   PVD (peripheral vascular disease) ([place])       Abrasion of right foot, initial encounter         -Examined patient. -Discussed and educated patient on diabetic foot care, especially with  regards to the vascular, neurological and musculoskeletal systems.  -Stressed the importance of good glycemic control and the detriment of not  controlling glucose levels in relation to the foot. -Mechanically debrided all nails 1-5 bilateral using sterile nail nipper and filed with dremel without incident  -Referral to vascular to establish care extensive history of CVD and PVD, recently relocated/moved to [place]; request [place] -Answered all patient questions -Patient to return  in 3 months for at risk foot care -Patient advised to call the office if any problems or questions arise in the meantime.  Landis Martins, DPM

## 2019-09-11 ENCOUNTER — Telehealth: Payer: Self-pay | Admitting: *Deleted

## 2019-09-11 DIAGNOSIS — I739 Peripheral vascular disease, unspecified: Secondary | ICD-10-CM

## 2019-09-11 DIAGNOSIS — B351 Tinea unguium: Secondary | ICD-10-CM

## 2019-09-11 DIAGNOSIS — E119 Type 2 diabetes mellitus without complications: Secondary | ICD-10-CM

## 2019-09-11 DIAGNOSIS — M79674 Pain in right toe(s): Secondary | ICD-10-CM

## 2019-09-11 NOTE — Telephone Encounter (Signed)
-----   Message from Landis Martins, [place] sent at 09/10/2019 10:44 PM EDT ----- Regarding: Referal to Vascular in [place] Patient recently moved to area and used to see vascular. Needs to establish care.

## 2019-09-15 NOTE — Telephone Encounter (Signed)
Faxed referral, clinicals and demographics to [facility] and Vascular.

## 2019-09-19 ENCOUNTER — Ambulatory Visit (INDEPENDENT_AMBULATORY_CARE_PROVIDER_SITE_OTHER): Payer: Medicare Other | Admitting: Cardiology

## 2019-09-19 ENCOUNTER — Encounter: Payer: Self-pay | Admitting: Cardiology

## 2019-09-19 ENCOUNTER — Other Ambulatory Visit: Payer: Self-pay

## 2019-09-19 VITALS — BP 132/64 | HR 72 | Temp 99.2°F | Ht 65.0 in | Wt 210.8 lb

## 2019-09-19 DIAGNOSIS — E782 Mixed hyperlipidemia: Secondary | ICD-10-CM

## 2019-09-19 DIAGNOSIS — E559 Vitamin D deficiency, unspecified: Secondary | ICD-10-CM

## 2019-09-19 DIAGNOSIS — R6889 Other general symptoms and signs: Secondary | ICD-10-CM

## 2019-09-19 DIAGNOSIS — I5032 Chronic diastolic (congestive) heart failure: Secondary | ICD-10-CM

## 2019-09-19 DIAGNOSIS — I2510 Atherosclerotic heart disease of native coronary artery without angina pectoris: Secondary | ICD-10-CM

## 2019-09-19 DIAGNOSIS — I4729 Other ventricular tachycardia: Secondary | ICD-10-CM

## 2019-09-19 DIAGNOSIS — I739 Peripheral vascular disease, unspecified: Secondary | ICD-10-CM

## 2019-09-19 DIAGNOSIS — I472 Ventricular tachycardia: Secondary | ICD-10-CM | POA: Diagnosis not present

## 2019-09-19 DIAGNOSIS — I493 Ventricular premature depolarization: Secondary | ICD-10-CM | POA: Diagnosis not present

## 2019-09-19 DIAGNOSIS — Z794 Long term (current) use of insulin: Secondary | ICD-10-CM

## 2019-09-19 DIAGNOSIS — E119 Type 2 diabetes mellitus without complications: Secondary | ICD-10-CM

## 2019-09-19 DIAGNOSIS — R5383 Other fatigue: Secondary | ICD-10-CM

## 2019-09-19 HISTORY — DX: Vitamin D deficiency, unspecified: E55.9

## 2019-09-19 HISTORY — DX: Other general symptoms and signs: R68.89

## 2019-09-19 NOTE — Progress Notes (Signed)
Cardiology Office Note:    Date:  09/19/2019   ID:  Oscar Kim, DOB 1941-01-01, MRN [id number]  PCP:  [name], NP  Cardiologist:  Berniece Salines, DO  Electrophysiologist:  None   Referring MD: [name], NP  The patient is here for a follow up visit.  " I just don't have the get up and go like I used to"  History of Present Illness:    Oscar Kim is a 79 y.o. 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  I saw the patient on 07/23/2019 after he presented to prescribed vascular care due to his moved from [place] to [place].  At that time he reported that he was experiencing significant shortness of breath which is suspected was multifactorial.  I did increase his Lasix to 60 mg daily and 40 mg in the p.m. he did get blood work.   Interim his lab work came back reporting significant BNP therefore I gave the patient metolazone to help with active diuresis.  Days later the patient son called reporting that he was experiencing shortness of breath.  Therefore patient was sent to the emergency department, he spent a few days at the hospital where he was treated for heart failure as well as acute kidney injury.  I saw the patient on August 22, 2019 at that time he was posthospitalization.  We discussed his frequent PVCs and multiple episodes of NSVT.  At that time I discussed with the patient who is agreeable to start amiodarone which did start the patient on amiodarone.  He also had bilateral leg edema and had not been taking his Lasix therefore cautious diuretics at 20 mg daily.  Past Medical History:  Diagnosis Date  . CAD (coronary artery disease)    Reported infarct and subsequent stent intervention approximately 15 years ago - [place]    . Chronic diastolic heart failure ([place]) 08/22/2019  . Chronic systolic CHF (congestive heart failure) ([place]) 07/23/2019  . Chronic systolic heart failure ([place])   . Coronary artery disease involving native coronary artery of native heart without angina pectoris 07/23/2019  . Dyspnea 07/23/2019  . Essential hypertension   . Fatigue 07/23/2019  . 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 Hyperlipidemia   . Mixed hyperlipidemia 07/23/2019  . NSVT (nonsustained ventricular tachycardia) ([place]) 08/22/2019  . Obesity (BMI 30-39.9)   . PAD (peripheral artery disease) ([facility])    Previous stent revascularization, left leg - [place]  . Type 2 diabetes mellitus ([place])   . Type 2 diabetes mellitus without complication, with long-term current use of insulin ([place]) 07/31/2019    Past Surgical History:  Procedure Laterality Date  . CORONARY ANGIOPLASTY WITH STENT PLACEMENT     When he lived in [place], multiple stents placed  . KNEE SURGERY Right     Current Medications: Current Meds  Medication Sig  . allopurinol (ZYLOPRIM) 100 MG tablet Take 100 mg by mouth 2 (two) times daily.  Marland Kitchen amiodarone (PACERONE) 200 MG tablet Take 1 tablet (200 mg total) by mouth daily.  Marland Kitchen aspirin EC 81 MG tablet Take 81 mg by mouth daily.  . carvedilol (COREG) 6.25 MG tablet Take 0.5 tablets (3.125 mg total) by mouth 2 (two) times daily with a meal.  . clopidogrel (PLAVIX) 75 MG tablet Take 75 mg  by mouth daily.  . furosemide (LASIX) 40 MG tablet Take 1 tablet (40 mg total) by mouth daily.  Marland Kitchen gabapentin (NEURONTIN) 300 MG capsule Take 300 mg by mouth 2 (two) times daily.   . insulin NPH-regular Human (70-30) 100 UNIT/ML injection Inject 34 Units into the skin 2 (two) times daily with a meal.   . pantoprazole (PROTONIX) 40 MG tablet Take 1 tablet (40 mg total) by mouth daily.  . potassium chloride SA (KLOR-CON) 20 MEQ tablet Take 1 tablet (20 mEq total) by mouth daily.  . traMADol (ULTRAM) 50 MG tablet Take 50 mg by mouth in the  morning, at noon, and at bedtime.     Allergies:   Patient has no known allergies.   Social History   Socioeconomic History  . Marital status: Married    Spouse name: Not on file  . Number of children: Not on file  . Years of education: Not on file  . Highest education level: Not on file  Occupational History  . Not on file  Tobacco Use  . Smoking status: Former Smoker    Types: Cigarettes    Quit date: 07/23/1979    Years since quitting: 40.1  . Smokeless tobacco: Current User    Types: Snuff  Substance and Sexual Activity  . Alcohol use: Never  . Drug use: Never  . Sexual activity: Not on file  Other Topics Concern  . Not on file  Social History Narrative  . Not on file   Social Determinants of Health   Financial Resource Strain:   . Difficulty of Paying Living Expenses:   Food Insecurity:   . Worried About Charity fundraiser in the Last Year:   . Arboriculturist in the Last Year:   Transportation Needs:   . Film/video editor (Medical):   Marland Kitchen Lack of Transportation (Non-Medical):   Physical Activity:   . Days of Exercise per Week:   . Minutes of Exercise per Session:   Stress:   . Feeling of Stress :   Social Connections:   . Frequency of Communication with Friends and Family:   . Frequency of Social Gatherings with Friends and Family:   . Attends Religious Services:   . Active Member of Clubs or Organizations:   . Attends Archivist Meetings:   Marland Kitchen Marital Status:      Family History: The patient's family history includes Hypertension in his father.  ROS:   Review of Systems  Constitution: Negative for decreased appetite, fever and weight gain.  HENT: Negative for congestion, ear discharge, hoarse voice and sore throat.   Eyes: Negative for discharge, redness, vision loss in right eye and visual halos.  Cardiovascular: Negative for chest pain, dyspnea on exertion, leg swelling, orthopnea and palpitations.  Respiratory: Negative for cough,  hemoptysis, shortness of breath and snoring.   Endocrine: Negative for heat intolerance and polyphagia.  Hematologic/Lymphatic: Negative for bleeding problem. Does not bruise/bleed easily.  Skin: Negative for flushing, nail changes, rash and suspicious lesions.  Musculoskeletal: Negative for arthritis, joint pain, muscle cramps, myalgias, neck pain and stiffness.  Gastrointestinal: Negative for abdominal pain, bowel incontinence, diarrhea and excessive appetite.  Genitourinary: Negative for decreased libido, genital sores and incomplete emptying.  Neurological: Negative for brief paralysis, focal weakness, headaches and loss of balance.  Psychiatric/Behavioral: Negative for altered mental status, depression and suicidal ideas.  Allergic/Immunologic: Negative for HIV exposure and persistent infections.    EKGs/Labs/Other Studies Reviewed:  The following studies were reviewed today:   EKG:  None today  TTE IMPRESSIONS 07/30/2019 1. Limited apicals due to patient not cooperating . Left ventricular  ejection fraction, by estimation, is 60 to 65%. The left ventricle has  normal function. The left ventricle has no regional wall motion  abnormalities. There is moderate left ventricular  hypertrophy. Left ventricular diastolic parameters are indeterminate.  2. Right ventricular systolic function is normal. The right ventricular  size is normal.  3. Left atrial size was mildly dilated.  4. The mitral valve is degenerative. Mild mitral valve regurgitation. No  evidence of mitral stenosis.  5. The aortic valve is tricuspid. Aortic valve regurgitation is trivial.  Mild aortic valve sclerosis is present, with no evidence of aortic valve  stenosis.  6. The inferior vena cava is normal in size with greater than 50%  respiratory variability, suggesting right atrial pressure of 3 mmHg.   Zio Monitor  The patient wore the monitor for 2 days 1 hours starting 07/23/2019. Indication:  Premature ventricular complexes  The minimum heart rate was 53 bpm, maximum heart rate was 200 bpm, and average heart rate was 79 bpm. Predominant underlying rhythm was Sinus Rhythm. Slight P wave morphology changes were noted.   165 Ventricular Tachycardia runs occurred, the run with the fastest interval lasting 4 beats with a maximum rate of 200 bpm, the longest lasting 13 beats with an average rate of 104 bpm.   43 Supraventricular Tachycardia runs occurred, the run with the fastest interval lasting 4 beats with a maximum rate of 182 bpm, the longest lasting 31.2 seconds with an average rate of 101 bpm.  Premature atrial complexes were occasional (2.6%, 6322). Premature Ventricular complexes were frequent (31.6%, [id number]). VE Couplets were occasional (1.9%, 2283), and VE Triplets were rare (<1.0%, 194). Ventricular Bigeminy and Trigeminy were present.  No pauses, No AV block and no atrial fibrillation present. No patient triggered events and diary events noted.   Conclusion: This study is remarkable for the following: 1. 165 episodes of Nonsustained ventricular tachycardia. 2. Frequent Premature ventricular complexes (31.6%, 75803). 3. 43 Paroxysmal supraventricular tachycardia which may be atrial tachycardia. 4. Occasional premature atrial complex.    Recent Labs: 07/23/2019: NT-Pro BNP 1,962; TSH 2.270 07/29/2019: B Natriuretic Peptide 65.0 07/31/2019: Hemoglobin 11.9; Platelets 275 08/22/2019: ALT 14; BUN 28; Creatinine, Ser 1.72; Magnesium 2.2; Potassium 4.6; Sodium 140  Recent Lipid Panel    Component Value Date/Time   CHOL 237 (H) 07/31/2019 0256   TRIG 192 (H) 07/31/2019 0256   HDL 31 (L) 07/31/2019 0256   CHOLHDL 7.6 07/31/2019 0256   VLDL 38 07/31/2019 0256   LDLCALC 168 (H) 07/31/2019 0256    Physical Exam:    VS:  BP 132/64 (BP Location: Right Arm,  Patient Position: Sitting, Cuff Size: Normal)   Pulse 72   Temp 99.2 F (37.3 C)   Ht 5\' 5"  (1.651 m)   Wt 210 lb 12.8 oz (95.6 kg)   SpO2 99%   BMI 35.08 kg/m     Wt Readings from Last 3 Encounters:  09/19/19 210 lb 12.8 oz (95.6 kg)  08/22/19 209 lb (94.8 kg)  07/31/19 197 lb 3.2 oz (89.4 kg)     GEN: Well nourished, well developed in no acute distress HEENT: Normal NECK: No JVD; No carotid bruits LYMPHATICS: No lymphadenopathy CARDIAC: S1S2 noted,RRR, no murmurs, rubs, gallops RESPIRATORY:  Clear to auscultation without rales, wheezing or rhonchi  ABDOMEN: Soft, non-tender, non-distended, +bowel sounds, no  guarding. EXTREMITIES: Trace ankle edema, No cyanosis, no clubbing MUSCULOSKELETAL:  No deformity  SKIN: Warm and dry NEUROLOGIC:  Alert and oriented x 3, non-focal PSYCHIATRIC:  Normal affect, good insight  ASSESSMENT:    1. Frequent PVCs   2. NSVT (nonsustained ventricular tachycardia) ([place])   3. Chronic heart failure with preserved ejection fraction ([place])   4. Coronary artery disease involving native coronary artery of native heart without angina pectoris   5. Vitamin D deficiency, unspecified    6. Other general symptoms and signs    7. PAD (peripheral artery disease) ([place])   8. Type 2 diabetes mellitus without complication, with long-term current use of insulin ([place])   9. Mixed hyperlipidemia   10. Fatigue, unspecified type    PLAN:    At this time I like to get basic metabolic along with TSH and vitamin D, vitamin B12 to assess his fatigue.   Clinically he appears to be euvolemic today.  Continue on his current Lasix dose. No changes will be made to his antihypertensive medication.  Type 2 diabetes-insulin per patient PCP.  He prefers to stay off medication for this for now.  We will discuss this again in normal visit I do think that PCSK9 can also be beneficial in this patient.   The patient is in agreement with the above plan. The patient left the  office in stable condition.  The patient will follow up in 1 month or sooner as needed.   Medication Adjustments/Labs and Tests Ordered: Current medicines are reviewed at length with the patient today.  Concerns regarding medicines are outlined above.  Orders Placed This Encounter  Procedures  . Basic Metabolic Panel (BMET)  . CBC  . TSH  . Magnesium  . VITAMIN D 25 Hydroxy (Vit-D Deficiency, Fractures)  . B12   No orders of the defined types were placed in this encounter.   Patient Instructions  Medication Instructions:  Your physician recommends that you continue on your current medications as directed. Please refer to the Current Medication list given to you today.  *If you need a refill on your cardiac medications before your next appointment, please call your pharmacy*   Lab Work: Your physician recommends that you return for lab work in: TODAY BMP, CBC, TSH, Mag, Vitamin D, Vitamin B12 If you have labs (blood work) drawn today and your tests are completely normal, you will receive your results only by: Marland Kitchen MyChart Message (if you have MyChart) OR . A paper copy in the mail If you have any lab test that is abnormal or we need to change your treatment, we will call you to review the results.   Testing/Procedures: None   Follow-Up: At [facility], you and your health needs are our priority.  As part of our continuing mission to provide you with exceptional heart care, we have created designated Provider Care Teams.  These Care Teams include your primary Cardiologist (physician) and Advanced Practice Providers (APPs -  Physician Assistants and Nurse Practitioners) who all work together to provide you with the care you need, when you need it.  We recommend signing up for the patient portal called "MyChart".  Sign up information is provided on this After Visit Summary.  MyChart is used to connect with patients for Virtual Visits (Telemedicine).  Patients are able to view  lab/test results, encounter notes, upcoming appointments, etc.  Non-urgent messages can be sent to your provider as well.   To learn more about what you can do  with MyChart, go to [email or web address].    Your next appointment:   1 month(s)  The format for your next appointment:   In Person  Provider:   Berniece Salines, DO   Other Instructions      Adopting a Healthy Lifestyle.  Know what a healthy weight is for you (roughly BMI <25) and aim to maintain this   Aim for 7+ servings of fruits and vegetables daily   65-80+ fluid ounces of water or unsweet tea for healthy kidneys   Limit to max 1 drink of alcohol per day; avoid smoking/tobacco   Limit animal fats in diet for cholesterol and heart health - choose grass fed whenever available   Avoid highly processed foods, and foods high in saturated/trans fats   Aim for low stress - take time to unwind and care for your mental health   Aim for 150 min of moderate intensity exercise weekly for heart health, and weights twice weekly for bone health   Aim for 7-9 hours of sleep daily   When it comes to diets, agreement about the perfect plan isnt easy to find, even among the experts. Experts at the [employer] developed an idea known as the Healthy Eating Plate. Just imagine a plate divided into logical, healthy portions.   The emphasis is on diet quality:   Load up on vegetables and fruits - one-half of your plate: Aim for color and variety, and remember that potatoes dont count.   Go for whole grains - one-quarter of your plate: Whole wheat, barley, wheat berries, quinoa, oats, brown rice, and foods made with them. If you want pasta, go with whole wheat pasta.   Protein power - one-quarter of your plate: Fish, chicken, beans, and nuts are all healthy, versatile protein sources. Limit red meat.   The diet, however, does go beyond the plate, offering a few other suggestions.   Use healthy plant oils,  such as olive, canola, soy, corn, sunflower and peanut. Check the labels, and avoid partially hydrogenated oil, which have unhealthy trans fats.   If youre thirsty, drink water. Coffee and tea are good in moderation, but skip sugary drinks and limit milk and dairy products to one or two daily servings.   The type of carbohydrate in the diet is more important than the amount. Some sources of carbohydrates, such as vegetables, fruits, whole grains, and beans-are healthier than others.   Finally, stay active  Signed, Berniece Salines, DO  09/19/2019 9:20 AM    [place]

## 2019-09-19 NOTE — Patient Instructions (Signed)
Medication Instructions:  Your physician recommends that you continue on your current medications as directed. Please refer to the Current Medication list given to you today.  *If you need a refill on your cardiac medications before your next appointment, please call your pharmacy*   Lab Work: Your physician recommends that you return for lab work in: TODAY BMP, CBC, TSH, Mag, Vitamin D, Vitamin B12 If you have labs (blood work) drawn today and your tests are completely normal, you will receive your results only by: Marland Kitchen MyChart Message (if you have MyChart) OR . A paper copy in the mail If you have any lab test that is abnormal or we need to change your treatment, we will call you to review the results.   Testing/Procedures: None   Follow-Up: At [facility], you and your health needs are our priority.  As part of our continuing mission to provide you with exceptional heart care, we have created designated Provider Care Teams.  These Care Teams include your primary Cardiologist (physician) and Advanced Practice Providers (APPs -  Physician Assistants and Nurse Practitioners) who all work together to provide you with the care you need, when you need it.  We recommend signing up for the patient portal called "MyChart".  Sign up information is provided on this After Visit Summary.  MyChart is used to connect with patients for Virtual Visits (Telemedicine).  Patients are able to view lab/test results, encounter notes, upcoming appointments, etc.  Non-urgent messages can be sent to your provider as well.   To learn more about what you can do with MyChart, go to [email or web address].    Your next appointment:   1 month(s)  The format for your next appointment:   In Person  Provider:   Berniece Salines, DO   Other Instructions

## 2019-09-20 LAB — CBC
Hematocrit: 33.6 % — ABNORMAL LOW (ref 37.5–51.0)
Hemoglobin: 11 g/dL — ABNORMAL LOW (ref 13.0–17.7)
MCH: 27.6 pg (ref 26.6–33.0)
MCHC: 32.7 g/dL (ref 31.5–35.7)
MCV: 84 fL (ref 79–97)
Platelets: 195 10*3/uL (ref 150–450)
RBC: 3.99 x10E6/uL — ABNORMAL LOW (ref 4.14–5.80)
RDW: 14.8 % (ref 11.6–15.4)
WBC: 6.5 10*3/uL (ref 3.4–10.8)

## 2019-09-20 LAB — BASIC METABOLIC PANEL
BUN/Creatinine Ratio: 12 (ref 10–24)
BUN: 18 mg/dL (ref 8–27)
CO2: 23 mmol/L (ref 20–29)
Calcium: 8.8 mg/dL (ref 8.6–10.2)
Chloride: 100 mmol/L (ref 96–106)
Creatinine, Ser: 1.54 mg/dL — ABNORMAL HIGH (ref 0.76–1.27)
GFR calc Af Amer: 50 mL/min/{1.73_m2} — ABNORMAL LOW (ref >59)
GFR calc non Af Amer: 43 mL/min/{1.73_m2} — ABNORMAL LOW (ref >59)
Glucose: 331 mg/dL — ABNORMAL HIGH (ref 65–99)
Potassium: 4.1 mmol/L (ref 3.5–5.2)
Sodium: 138 mmol/L (ref 134–144)

## 2019-09-20 LAB — TSH: TSH: 3.18 u[IU]/mL (ref 0.450–4.500)

## 2019-09-20 LAB — VITAMIN D 25 HYDROXY (VIT D DEFICIENCY, FRACTURES): Vit D, 25-Hydroxy: 15.1 ng/mL — ABNORMAL LOW (ref 30.0–100.0)

## 2019-09-20 LAB — MAGNESIUM: Magnesium: 2.1 mg/dL (ref 1.6–2.3)

## 2019-09-20 LAB — VITAMIN B12: Vitamin B-12: 612 pg/mL (ref 232–1245)

## 2019-09-22 ENCOUNTER — Telehealth: Payer: Self-pay

## 2019-09-22 NOTE — Telephone Encounter (Signed)
Spoke to the patient just now. He let me know that he would be purchasing this Vitamin D3 over the counter. I advised him on how to take this medication and he verbalizes understanding. No other issues or concerns were noted a this time.    Encouraged patient to call back with any questions or concerns.

## 2019-09-22 NOTE — Telephone Encounter (Signed)
-----   Message from Berniece Salines, DO sent at 09/21/2019  9:04 AM EDT ----- Your creatinine is improving compared to 1 month ago. Your blood sugar is higher ( I recommend you see your pcp for consideration to adjust your diabetes medication). TSH is normal. Vitamin D is 15 ( suggest vitamin D deficiency which could be causing your symptom). I will like to replete your vitamin D - Vitamin D3 50,000IU once weakly for 12 weeks.

## 2019-10-20 ENCOUNTER — Ambulatory Visit (INDEPENDENT_AMBULATORY_CARE_PROVIDER_SITE_OTHER): Payer: Medicare Other | Admitting: Cardiology

## 2019-10-20 ENCOUNTER — Encounter: Payer: Self-pay | Admitting: Cardiology

## 2019-10-20 ENCOUNTER — Other Ambulatory Visit: Payer: Self-pay

## 2019-10-20 VITALS — BP 153/71 | HR 57 | Ht 65.0 in | Wt 208.0 lb

## 2019-10-20 DIAGNOSIS — I10 Essential (primary) hypertension: Secondary | ICD-10-CM | POA: Diagnosis not present

## 2019-10-20 DIAGNOSIS — I2510 Atherosclerotic heart disease of native coronary artery without angina pectoris: Secondary | ICD-10-CM | POA: Diagnosis not present

## 2019-10-20 DIAGNOSIS — I4729 Other ventricular tachycardia: Secondary | ICD-10-CM

## 2019-10-20 DIAGNOSIS — I472 Ventricular tachycardia: Secondary | ICD-10-CM

## 2019-10-20 DIAGNOSIS — I493 Ventricular premature depolarization: Secondary | ICD-10-CM | POA: Diagnosis not present

## 2019-10-20 DIAGNOSIS — E559 Vitamin D deficiency, unspecified: Secondary | ICD-10-CM

## 2019-10-20 DIAGNOSIS — I739 Peripheral vascular disease, unspecified: Secondary | ICD-10-CM | POA: Diagnosis not present

## 2019-10-20 DIAGNOSIS — I5032 Chronic diastolic (congestive) heart failure: Secondary | ICD-10-CM

## 2019-10-20 MED ORDER — VITAMIN D (ERGOCALCIFEROL) 1.25 MG (50000 UNIT) PO CAPS: 50000.0000 [IU] | ORAL_CAPSULE | ORAL | 0 refills | Status: DC

## 2019-10-20 NOTE — Progress Notes (Signed)
Cardiology Office Note:    Date:  10/20/2019   ID:  Oscar Kim, DOB [birth year], MRN [id number]  PCP:  [name], NP  Cardiologist:  Berniece Salines, DO  Electrophysiologist:  None   Referring MD: [name], NP   "I am doing pretty good"  History of Present Illness:    Oscar Kim is a 79 y.o. male with a hx of coronary of artery disease status post stents 15 years ago, PAD status post stenting,chronic diastolic heart failure most recent ejection fraction 60 to 65%, frequent PVCwith multiple episodes of NSVT on monitor, hypertension, hyperlipidemia, diabetes mellitus and obesity.  I saw the patient on3/17/2021after he presented toprescribed vascular care due to his moved from [place] to [place]. At that time he reported that he was experiencing significant shortness of breath which is suspected was multifactorial. I did increase his Lasix to 60 mg daily and 40 mg in the p.m. he did get blood work.  Interim his lab work came back reporting significant BNP therefore I gave the patient metolazone to help with active diuresis. Days later the patient son called reporting that he was experiencing shortness of breath. Therefore patient was sent to the emergency department, he spent a few days at the hospital where he was treated for heart failure as well as acute kidney injury.  I saw the patient on August 22, 2019 at that time he was posthospitalization.  We discussed his frequent PVCs and multiple episodes of NSVT.  At that time I discussed with the patient who is agreeable to start amiodarone which did start the patient on amiodarone.  He also had bilateral leg edema and had not been taking his Lasix therefore cautious diuretics at 20 mg daily.  He was seen on May 14,2021 at that time he reported to me that he had been experiencing significant fatigue.  Reports a vitamin D level, iron and B12 level and TSH.  His blood work reported vitamin D deficiency at which time but  the patient recommending that he take 50,000 units of vitamin D per week but he declined.  Today for follow-up visit.  He offers no complaints.   Past Medical History:  Diagnosis Date  . CAD (coronary artery disease)    Reported infarct and subsequent stent intervention approximately 15 years ago - [place]  . Chronic diastolic heart failure ([place]) 08/22/2019  . Chronic systolic CHF (congestive heart failure) ([place]) 07/23/2019  . Chronic systolic heart failure ([place])   . Coronary artery disease involving native coronary artery of native heart without angina pectoris 07/23/2019  . Dyspnea 07/23/2019  . Essential hypertension   . Fatigue 07/23/2019  . 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 Hyperlipidemia   . Mixed hyperlipidemia 07/23/2019  . NSVT (nonsustained ventricular tachycardia) ([place]) 08/22/2019  . Obesity (BMI 30-39.9)   . PAD (peripheral artery disease) ([facility])    Previous stent revascularization, left leg - [place]  . Type 2 diabetes mellitus ([place])   . Type 2 diabetes mellitus without complication, with long-term current use of insulin ([place]) 07/31/2019    Past Surgical History:  Procedure Laterality Date  . CORONARY ANGIOPLASTY WITH STENT PLACEMENT     When he lived in [place], multiple stents placed  . KNEE SURGERY Right     Current Medications: Current Meds  Medication Sig  . allopurinol (ZYLOPRIM) 100 MG tablet Take 100 mg by mouth 2 (two) times daily.  Marland Kitchen amiodarone (PACERONE) 200 MG tablet Take 1 tablet (200 mg total) by mouth daily.  Marland Kitchen  aspirin EC 81 MG tablet Take 81 mg by mouth daily.  . carvedilol (COREG) 6.25 MG tablet Take 0.5 tablets (3.125 mg total) by mouth 2 (two) times daily with a meal.  . clopidogrel (PLAVIX) 75 MG tablet Take 75 mg by mouth daily.  . furosemide (LASIX) 40 MG tablet Take 1 tablet (40 mg total) by mouth daily.  Marland Kitchen gabapentin (NEURONTIN) 300 MG capsule Take 300 mg by mouth 2 (two) times daily.   . insulin NPH-regular Human (70-30) 100 UNIT/ML  injection Inject 34 Units into the skin 2 (two) times daily with a meal.   . pantoprazole (PROTONIX) 40 MG tablet Take 1 tablet (40 mg total) by mouth daily.  . potassium chloride SA (KLOR-CON) 20 MEQ tablet Take 1 tablet (20 mEq total) by mouth daily.  . traMADol (ULTRAM) 50 MG tablet Take 50 mg by mouth in the morning, at noon, and at bedtime.     Allergies:   Patient has no known allergies.   Social History   Socioeconomic History  . Marital status: Married    Spouse name: Not on file  . Number of children: Not on file  . Years of education: Not on file  . Highest education level: Not on file  Occupational History  . Not on file  Tobacco Use  . Smoking status: Former Smoker    Types: Cigarettes    Quit date: 07/23/1979    Years since quitting: 40.2  . Smokeless tobacco: Current User    Types: Snuff  Vaping Use  . Vaping Use: Never used  Substance and Sexual Activity  . Alcohol use: Never  . Drug use: Never  . Sexual activity: Not on file  Other Topics Concern  . Not on file  Social History Narrative  . Not on file   Social Determinants of Health   Financial Resource Strain:   . Difficulty of Paying Living Expenses:   Food Insecurity:   . Worried About Charity fundraiser in the Last Year:   . Arboriculturist in the Last Year:   Transportation Needs:   . Film/video editor (Medical):   Marland Kitchen Lack of Transportation (Non-Medical):   Physical Activity:   . Days of Exercise per Week:   . Minutes of Exercise per Session:   Stress:   . Feeling of Stress :   Social Connections:   . Frequency of Communication with Friends and Family:   . Frequency of Social Gatherings with Friends and Family:   . Attends Religious Services:   . Active Member of Clubs or Organizations:   . Attends Archivist Meetings:   Marland Kitchen Marital Status:      Family History: The patient's family history includes Hypertension in his father.  ROS:   Review of Systems  Constitution:  Negative for decreased appetite, fever and weight gain.  HENT: Negative for congestion, ear discharge, hoarse voice and sore throat.   Eyes: Negative for discharge, redness, vision loss in right eye and visual halos.  Cardiovascular: Negative for chest pain, dyspnea on exertion, leg swelling, orthopnea and palpitations.  Respiratory: Negative for cough, hemoptysis, shortness of breath and snoring.   Endocrine: Negative for heat intolerance and polyphagia.  Hematologic/Lymphatic: Negative for bleeding problem. Does not bruise/bleed easily.  Skin: Negative for flushing, nail changes, rash and suspicious lesions.  Musculoskeletal: Negative for arthritis, joint pain, muscle cramps, myalgias, neck pain and stiffness.  Gastrointestinal: Negative for abdominal pain, bowel incontinence, diarrhea and excessive appetite.  Genitourinary: Negative for decreased libido, genital sores and incomplete emptying.  Neurological: Negative for brief paralysis, focal weakness, headaches and loss of balance.  Psychiatric/Behavioral: Negative for altered mental status, depression and suicidal ideas.  Allergic/Immunologic: Negative for HIV exposure and persistent infections.    EKGs/Labs/Other Studies Reviewed:    The following studies were reviewed today:   EKG:  The ekg ordered today demonstrates sinus bradycardia, heart rate 58 bpm  TTE IMPRESSIONS3/24/2021 1. Limited apicals due to patient not cooperating . Left ventricular  ejection fraction, by estimation, is 60 to 65%. The left ventricle has  normal function. The left ventricle has no regional wall motion  abnormalities. There is moderate left ventricular  hypertrophy. Left ventricular diastolic parameters are indeterminate.  2. Right ventricular systolic function is normal. The right ventricular  size is normal.  3. Left atrial size was mildly dilated.  4. The mitral valve is degenerative. Mild mitral valve regurgitation. No  evidence of mitral  stenosis.  5. The aortic valve is tricuspid. Aortic valve regurgitation is trivial.  Mild aortic valve sclerosis is present, with no evidence of aortic valve  stenosis.  6. The inferior vena cava is normal in size with greater than 50%  respiratory variability, suggesting right atrial pressure of 3 mmHg.   Zio Monitor The patient wore the monitor for 2 days 1 hours starting 07/23/2019. Indication: Premature ventricular complexes  The minimum heart rate was 53 bpm, maximum heart rate was 200 bpm, and average heart rate was 79 bpm. Predominant underlying rhythm was Sinus Rhythm. Slight P wave morphology changes were noted.   165 Ventricular Tachycardia runs occurred, the run with the fastest interval lasting 4 beats with a maximum rate of 200 bpm, the longest lasting 13 beats with an average rate of 104 bpm.   43 Supraventricular Tachycardia runs occurred, the run with the fastest interval lasting 4 beats with a maximum rate of 182 bpm, the longest lasting 31.2 seconds with an average rate of 101 bpm.  Premature atrial complexes were occasional (2.6%, 6322). Premature Ventricular complexes were frequent (31.6%, [id number]). VE Couplets were occasional (1.9%, 2283), and VE Triplets were rare (<1.0%, 194). Ventricular Bigeminy and Trigeminy were present.  No pauses, No AV block and no atrial fibrillation present. No patient triggered events and diary events noted.   Conclusion: This study is remarkable for the following: 1. 165 episodes of Nonsustained ventricular tachycardia. 2. Frequent Premature ventricular complexes (31.6%, 75803). 3. 43 Paroxysmal supraventricular tachycardia which may be atrial tachycardia. 4. Occasional premature atrial complex.  Recent Labs: 07/23/2019: NT-Pro BNP 1,962 07/29/2019: B Natriuretic Peptide 65.0 08/22/2019: ALT 14 09/19/2019: BUN 18;  Creatinine, Ser 1.54; Hemoglobin 11.0; Magnesium 2.1; Platelets 195; Potassium 4.1; Sodium 138; TSH 3.180  Recent Lipid Panel    Component Value Date/Time   CHOL 237 (H) 07/31/2019 0256   TRIG 192 (H) 07/31/2019 0256   HDL 31 (L) 07/31/2019 0256   CHOLHDL 7.6 07/31/2019 0256   VLDL 38 07/31/2019 0256   LDLCALC 168 (H) 07/31/2019 0256    Physical Exam:    VS:  BP (!) 153/71 (BP Location: Right Arm, Patient Position: Sitting, Cuff Size: Normal)   Pulse (!) 57   Ht 5\' 5"  (1.651 m)   Wt 208 lb (94.3 kg)   SpO2 98%   BMI 34.61 kg/m     Wt Readings from Last 3 Encounters:  10/20/19 208 lb (94.3 kg)  09/19/19 210 lb 12.8 oz (95.6 kg)  08/22/19 209 lb (94.8 kg)  GEN: Well nourished, well developed in no acute distress HEENT: Normal NECK: No JVD; No carotid bruits LYMPHATICS: No lymphadenopathy CARDIAC: S1S2 noted,RRR, no murmurs, rubs, gallops RESPIRATORY:  Clear to auscultation without rales, wheezing or rhonchi  ABDOMEN: Soft, non-tender, non-distended, +bowel sounds, no guarding. EXTREMITIES: No edema, No cyanosis, no clubbing MUSCULOSKELETAL:  No deformity  SKIN: Warm and dry NEUROLOGIC:  Alert and oriented x 3, non-focal PSYCHIATRIC:  Normal affect, good insight  ASSESSMENT:    1. Coronary artery disease involving native coronary artery of native heart without angina pectoris   2. Essential hypertension   3. Frequent PVCs   4. PAD (peripheral artery disease) ([place])   5. NSVT (nonsustained ventricular tachycardia) ([facility])   6. Chronic diastolic heart failure ([place])   7. Vitamin D deficiency, unspecified     PLAN:     1.  He appears to be stable from a heart failure standpoint.  Continue patient's current diuretic dose.  2.  No anginal symptoms at this time, continue patient on aspirin, Plavix.  He is not on a statin and still defers starting statin medication.   3.  Frequent PVCs/NSVT-palpitations.  Continue patient amiodarone  4.  Vitamin D deficiency-he is  agreeable to start 50,000 units x 1 week for 12 weeks.  5.  Hypertension-blood pressure slightly elevated in the office today, this is unusual-we will continue to monitor the patient approximately his blood pressure daily and if this is consistent we will recheck his antihypertensive medication as appropriate.  The patient is in agreement with the above plan. The patient left the office in stable condition.  The patient will follow up in 3 months or sooner if needed.   Medication Adjustments/Labs and Tests Ordered: Current medicines are reviewed at length with the patient today.  Concerns regarding medicines are outlined above.  Orders Placed This Encounter  Procedures  . EKG 12-Lead   Meds ordered this encounter  Medications  . Vitamin D, Ergocalciferol, (DRISDOL) 1.25 MG (50000 UNIT) CAPS capsule    Sig: Take 1 capsule (50,000 Units total) by mouth every 7 (seven) days.    Dispense:  12 capsule    Refill:  0    Patient Instructions  Medication Instructions:  Your physician has recommended you make the following change in your medication:  START: Vitamin D 50,000 units. Take one tablet by mouth every 7 days for 12 weeks.  *If you need a refill on your cardiac medications before your next appointment, please call your pharmacy*   Lab Work: None If you have labs (blood work) drawn today and your tests are completely normal, you will receive your results only by: Marland Kitchen MyChart Message (if you have MyChart) OR . A paper copy in the mail If you have any lab test that is abnormal or we need to change your treatment, we will call you to review the results.   Testing/Procedures: None   Follow-Up: At [facility], you and your health needs are our priority.  As part of our continuing mission to provide you with exceptional heart care, we have created designated Provider Care Teams.  These Care Teams include your primary Cardiologist (physician) and Advanced Practice Providers (APPs -   Physician Assistants and Nurse Practitioners) who all work together to provide you with the care you need, when you need it.  We recommend signing up for the patient portal called "MyChart".  Sign up information is provided on this After Visit Summary.  MyChart is used to connect with patients  for Virtual Visits (Telemedicine).  Patients are able to view lab/test results, encounter notes, upcoming appointments, etc.  Non-urgent messages can be sent to your provider as well.   To learn more about what you can do with MyChart, go to [email or web address].    Your next appointment:   3 month(s)  The format for your next appointment:   In Person  Provider:   Berniece Salines, DO   Other Instructions      Adopting a Healthy Lifestyle.  Know what a healthy weight is for you (roughly BMI <25) and aim to maintain this   Aim for 7+ servings of fruits and vegetables daily   65-80+ fluid ounces of water or unsweet tea for healthy kidneys   Limit to max 1 drink of alcohol per day; avoid smoking/tobacco   Limit animal fats in diet for cholesterol and heart health - choose grass fed whenever available   Avoid highly processed foods, and foods high in saturated/trans fats   Aim for low stress - take time to unwind and care for your mental health   Aim for 150 min of moderate intensity exercise weekly for heart health, and weights twice weekly for bone health   Aim for 7-9 hours of sleep daily   When it comes to diets, agreement about the perfect plan isnt easy to find, even among the experts. Experts at the [employer] developed an idea known as the Healthy Eating Plate. Just imagine a plate divided into logical, healthy portions.   The emphasis is on diet quality:   Load up on vegetables and fruits - one-half of your plate: Aim for color and variety, and remember that potatoes dont count.   Go for whole grains - one-quarter of your plate: Whole wheat, barley, wheat  berries, quinoa, oats, brown rice, and foods made with them. If you want pasta, go with whole wheat pasta.   Protein power - one-quarter of your plate: Fish, chicken, beans, and nuts are all healthy, versatile protein sources. Limit red meat.   The diet, however, does go beyond the plate, offering a few other suggestions.   Use healthy plant oils, such as olive, canola, soy, corn, sunflower and peanut. Check the labels, and avoid partially hydrogenated oil, which have unhealthy trans fats.   If youre thirsty, drink water. Coffee and tea are good in moderation, but skip sugary drinks and limit milk and dairy products to one or two daily servings.   The type of carbohydrate in the diet is more important than the amount. Some sources of carbohydrates, such as vegetables, fruits, whole grains, and beans-are healthier than others.   Finally, stay active  Signed, Berniece Salines, DO  10/20/2019 8:43 AM    [place]

## 2019-10-20 NOTE — Patient Instructions (Signed)
Medication Instructions:  Your physician has recommended you make the following change in your medication:  START: Vitamin D 50,000 units. Take one tablet by mouth every 7 days for 12 weeks.  *If you need a refill on your cardiac medications before your next appointment, please call your pharmacy*   Lab Work: None If you have labs (blood work) drawn today and your tests are completely normal, you will receive your results only by: Marland Kitchen MyChart Message (if you have MyChart) OR . A paper copy in the mail If you have any lab test that is abnormal or we need to change your treatment, we will call you to review the results.   Testing/Procedures: None   Follow-Up: At [facility], you and your health needs are our priority.  As part of our continuing mission to provide you with exceptional heart care, we have created designated Provider Care Teams.  These Care Teams include your primary Cardiologist (physician) and Advanced Practice Providers (APPs -  Physician Assistants and Nurse Practitioners) who all work together to provide you with the care you need, when you need it.  We recommend signing up for the patient portal called "MyChart".  Sign up information is provided on this After Visit Summary.  MyChart is used to connect with patients for Virtual Visits (Telemedicine).  Patients are able to view lab/test results, encounter notes, upcoming appointments, etc.  Non-urgent messages can be sent to your provider as well.   To learn more about what you can do with MyChart, go to [email or web address].    Your next appointment:   3 month(s)  The format for your next appointment:   In Person  Provider:   Berniece Salines, DO   Other Instructions

## 2019-11-27 ENCOUNTER — Other Ambulatory Visit: Payer: Self-pay | Admitting: Cardiology

## 2019-11-27 MED ORDER — AMIODARONE HCL 200 MG PO TABS: 200.0000 mg | ORAL_TABLET | Freq: Every day | ORAL | 3 refills | Status: DC

## 2019-11-27 NOTE — Telephone Encounter (Signed)
Refill sent in per request.  

## 2019-11-27 NOTE — Telephone Encounter (Signed)
*  STAT* If patient is at the pharmacy, call can be transferred to refill team.   1. Which medications need to be refilled? (please list name of each medication and dose if known) amiodarone (PACERONE) 200 MG tablet  2. Which pharmacy/location (including street and city if local pharmacy) is medication to be sent to? [facility] at [address] [place], [place] [address]  3. Do they need a 30 day or 90 day supply? [place]

## 2019-12-12 ENCOUNTER — Encounter: Payer: Self-pay | Admitting: Sports Medicine

## 2019-12-12 ENCOUNTER — Ambulatory Visit (INDEPENDENT_AMBULATORY_CARE_PROVIDER_SITE_OTHER): Payer: Medicare Other | Admitting: Sports Medicine

## 2019-12-12 ENCOUNTER — Other Ambulatory Visit: Payer: Self-pay

## 2019-12-12 DIAGNOSIS — B351 Tinea unguium: Secondary | ICD-10-CM

## 2019-12-12 DIAGNOSIS — M2042 Other hammer toe(s) (acquired), left foot: Secondary | ICD-10-CM

## 2019-12-12 DIAGNOSIS — E119 Type 2 diabetes mellitus without complications: Secondary | ICD-10-CM | POA: Diagnosis not present

## 2019-12-12 DIAGNOSIS — M79675 Pain in left toe(s): Secondary | ICD-10-CM

## 2019-12-12 DIAGNOSIS — M2041 Other hammer toe(s) (acquired), right foot: Secondary | ICD-10-CM

## 2019-12-12 DIAGNOSIS — M2141 Flat foot [pes planus] (acquired), right foot: Secondary | ICD-10-CM

## 2019-12-12 DIAGNOSIS — M2142 Flat foot [pes planus] (acquired), left foot: Secondary | ICD-10-CM

## 2019-12-12 DIAGNOSIS — I739 Peripheral vascular disease, unspecified: Secondary | ICD-10-CM | POA: Diagnosis not present

## 2019-12-12 DIAGNOSIS — M79674 Pain in right toe(s): Secondary | ICD-10-CM

## 2019-12-12 DIAGNOSIS — Z794 Long term (current) use of insulin: Secondary | ICD-10-CM

## 2019-12-12 NOTE — Progress Notes (Signed)
Subjective: Oscar Kim is a 79 y.o. male patient with history of diabetes presents to office for diabetic nail care. Patient reports that his legs are doing better his wife is changing dressings and he is getting care recommendations from his PCP. Patient reports that he wants new diabetic shoes. No other pedal complaints noted.  Fasting blood sugar 150 A1c "high" PCP [place] x 1 month ago    Patient Active Problem List   Diagnosis Date Noted  . Other general symptoms and signs  09/19/2019  . Vitamin D deficiency, unspecified  09/19/2019  . NSVT (nonsustained ventricular tachycardia) ([place]) 08/22/2019  . Chronic diastolic heart failure ([place]) 08/22/2019  . Type 2 diabetes mellitus without complication, with long-term current use of insulin ([place]) 07/31/2019  . 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 Chronic systolic CHF (congestive heart failure) ([place]) 07/23/2019  . Dyspnea 07/23/2019  . Fatigue 07/23/2019  . Coronary artery disease involving native coronary artery of native heart without angina pectoris 07/23/2019  . PAD (peripheral artery disease) ([place]) 07/23/2019  . Mixed hyperlipidemia 07/23/2019  . Essential hypertension 07/23/2019   Current Outpatient Medications on File Prior to Visit  Medication Sig Dispense Refill  . allopurinol (ZYLOPRIM) 100 MG tablet Take 100 mg by mouth 2 (two) times daily.    Marland Kitchen amiodarone (PACERONE) 200 MG tablet Take 1 tablet (200 mg total) by mouth daily. 90 tablet 3  . aspirin EC 81 MG tablet Take 81 mg by mouth daily.    . carvedilol (COREG) 6.25 MG tablet Take 0.5 tablets (3.125 mg total) by mouth 2 (two) times daily with a meal. 60 tablet 2  . clopidogrel (PLAVIX) 75 MG tablet Take 75 mg by mouth daily.    . furosemide (LASIX) 40 MG tablet Take 1 tablet (40 mg total) by mouth daily. 90 tablet 3  . gabapentin (NEURONTIN) 300 MG capsule Take 300 mg by mouth 2 (two) times daily.     . insulin NPH-regular Human (70-30) 100 UNIT/ML injection Inject 34 Units into the  skin 2 (two) times daily with a meal.     . pantoprazole (PROTONIX) 40 MG tablet Take 1 tablet (40 mg total) by mouth daily. 30 tablet 1  . potassium chloride SA (KLOR-CON) 20 MEQ tablet Take 1 tablet (20 mEq total) by mouth daily. 60 tablet 6  . traMADol (ULTRAM) 50 MG tablet Take 50 mg by mouth in the morning, at noon, and at bedtime.    . Vitamin D, Ergocalciferol, (DRISDOL) 1.25 MG (50000 UNIT) CAPS capsule Take 1 capsule (50,000 Units total) by mouth every 7 (seven) days. 12 capsule 0   No current facility-administered medications on file prior to visit.   No Known Allergies  Recent Results (from the past 2160 hour(s))  Basic Metabolic Panel (BMET)     Status: Abnormal   Collection Time: 09/19/19  9:06 AM  Result Value Ref Range   Glucose 331 (H) 65 - 99 mg/dL   BUN 18 8 - 27 mg/dL   Creatinine, Ser 1.54 (H) 0.76 - 1.27 mg/dL   GFR calc non Af Amer 43 (L) >59 mL/min/1.73   GFR calc Af Amer 50 (L) >59 mL/min/1.73    Comment: **[facility] currently reports eGFR in compliance with the current**   recommendations of the [employer]. [facility] will   update reporting as new guidelines are published from the NKF-ASN   Task force.    BUN/Creatinine Ratio 12 10 - 24   Sodium 138 134 - 144 mmol/L  Potassium 4.1 3.5 - 5.2 mmol/L   Chloride 100 96 - 106 mmol/L   CO2 23 20 - 29 mmol/L   Calcium 8.8 8.6 - 10.2 mg/dL  CBC     Status: Abnormal   Collection Time: 09/19/19  9:06 AM  Result Value Ref Range   WBC 6.5 3.4 - 10.8 x10E3/uL   RBC 3.99 (L) 4.14 - 5.80 x10E6/uL   Hemoglobin 11.0 (L) 13.0 - 17.7 g/dL   Hematocrit 33.6 (L) 37.5 - 51.0 %   MCV 84 79 - 97 fL   MCH 27.6 26.6 - 33.0 pg   MCHC 32.7 31 - 35 g/dL   RDW 14.8 11.6 - 15.4 %   Platelets 195 150 - 450 x10E3/uL  TSH     Status: None   Collection Time: 09/19/19  9:06 AM  Result Value Ref Range   TSH 3.180 0.450 - 4.500 uIU/mL  Magnesium     Status: None   Collection Time: 09/19/19  9:06 AM  Result Value Ref  Range   Magnesium 2.1 1.6 - 2.3 mg/dL  VITAMIN D 25 Hydroxy (Vit-D Deficiency, Fractures)     Status: Abnormal   Collection Time: 09/19/19  9:06 AM  Result Value Ref Range   Vit D, 25-Hydroxy 15.1 (L) 30.0 - 100.0 ng/mL    Comment: Vitamin D deficiency has been defined by the [place] and an Endocrine Society practice guideline as a level of serum 25-OH vitamin D less than 20 ng/mL (1,2). The Endocrine Society went on to further define vitamin D insufficiency as a level between 21 and 29 ng/mL (2). 1. IOM (Institute of Medicine). 2010. Dietary reference    intakes for calcium and D. [place]: The    [employer]. 2. Holick MF, [place], Bischoff-Ferrari HA, et al.    Evaluation, treatment, and prevention of vitamin D    deficiency: an Endocrine Society clinical practice    guideline. JCEM. 2011 Jul; 96(7):1911-30.   B12     Status: None   Collection Time: 09/19/19  9:06 AM  Result Value Ref Range   Vitamin B-12 612 232 - 1,245 pg/mL    Objective: General: Patient is awake, alert, and oriented x 3 and in no acute distress.  Integument: Skin is warm, dry and supple bilateral. Nails are tender, long, thickened and dystrophic with subungual debris, consistent with onychomycosis, 1-5 bilateral. Dry gauze to legs. Previous abrasions to right foot have healed. Remaining integument unremarkable.  Vasculature:  Dorsalis Pedis pulse 1/4 bilateral. Posterior Tibial pulse  0/4 bilateral. Capillary fill time <5 sec 1-5 bilateral. No hair growth to the level of the digits.Temperature gradient within normal limits.  Trophic skin changes present bilateral. 1+ pitting edema present bilateral.   Neurology: The patient has absent sensation measured with a 5.07/10g Semmes Weinstein Monofilament at all pedal sites bilateral . Vibratory sensation absent bilateral with tuning fork. No Babinski sign present bilateral.   Musculoskeletal: Asymptomatic pes planus pedal and  minimal hammertoe deformities noted bilateral. Muscular strength 5/5 in all lower extremity muscular groups bilateral without pain on range of motion. No tenderness with calf compression bilateral.  Assessment and Plan: Problem List Items Addressed This Visit      Endocrine   Type 2 diabetes mellitus without complication, with long-term current use of insulin ([place])    Other Visit Diagnoses    Pain due to onychomycosis of toenails of both feet    -  Primary   PVD (peripheral vascular disease) (  HCC)       Pes planus of both feet       Hammer toes of both feet          -Examined patient. -Re-Discussed and educated patient on diabetic foot care, especially with  regards to the vascular, neurological and musculoskeletal systems.  -Mechanically debrided all nails 1-5 bilateral using sterile nail nipper and filed with dremel without incident  -Answered all patient questions -Patient to return  in 3 months for at risk foot care and as scheduled for diabetic shoes -Patient advised to call the office if any problems or questions arise in the meantime.  Landis Martins, DPM

## 2019-12-15 ENCOUNTER — Telehealth: Payer: Self-pay | Admitting: Orthopedic Surgery

## 2019-12-15 NOTE — Telephone Encounter (Signed)
°

## 2019-12-15 NOTE — Telephone Encounter (Signed)
Vicente Serene is calling in because he would like Dr. Ninfa Linden to order [place] for Oscar Kim again.  Can we work on getting this ordered and approved and call them back to schedule?  Vicente Serene' call back # is [phone].

## 2019-12-16 NOTE — Telephone Encounter (Signed)
Submitted for VOB for Monovisc-right knee

## 2019-12-16 NOTE — Telephone Encounter (Signed)
Last injection was 08/27/19...next injection will need to be after 02/26/20

## 2019-12-16 NOTE — Telephone Encounter (Signed)
Can you please help with this ?

## 2019-12-17 ENCOUNTER — Telehealth: Payer: Self-pay

## 2019-12-17 NOTE — Telephone Encounter (Signed)
Lvm for pt to call back to schedule °

## 2019-12-17 NOTE — Telephone Encounter (Signed)
Approved for Monovisc-Right knee Dr. Margarito Liner and Bill Covered @ 100%-2nd insurance to pick up cost after medicare No prior auth required   Needs appt after 02/26/20

## 2020-01-06 ENCOUNTER — Ambulatory Visit (INDEPENDENT_AMBULATORY_CARE_PROVIDER_SITE_OTHER): Payer: Medicare Other | Admitting: Cardiology

## 2020-01-06 ENCOUNTER — Other Ambulatory Visit: Payer: Self-pay

## 2020-01-06 ENCOUNTER — Encounter: Payer: Self-pay | Admitting: Cardiology

## 2020-01-06 VITALS — BP 158/58 | HR 70 | Ht 67.0 in | Wt 279.4 lb

## 2020-01-06 DIAGNOSIS — I4729 Other ventricular tachycardia: Secondary | ICD-10-CM

## 2020-01-06 DIAGNOSIS — Z794 Long term (current) use of insulin: Secondary | ICD-10-CM

## 2020-01-06 DIAGNOSIS — I10 Essential (primary) hypertension: Secondary | ICD-10-CM

## 2020-01-06 DIAGNOSIS — I493 Ventricular premature depolarization: Secondary | ICD-10-CM | POA: Diagnosis not present

## 2020-01-06 DIAGNOSIS — I472 Ventricular tachycardia: Secondary | ICD-10-CM | POA: Diagnosis not present

## 2020-01-06 DIAGNOSIS — I2510 Atherosclerotic heart disease of native coronary artery without angina pectoris: Secondary | ICD-10-CM | POA: Diagnosis not present

## 2020-01-06 DIAGNOSIS — R5383 Other fatigue: Secondary | ICD-10-CM

## 2020-01-06 DIAGNOSIS — E119 Type 2 diabetes mellitus without complications: Secondary | ICD-10-CM

## 2020-01-06 DIAGNOSIS — I5032 Chronic diastolic (congestive) heart failure: Secondary | ICD-10-CM

## 2020-01-06 DIAGNOSIS — I739 Peripheral vascular disease, unspecified: Secondary | ICD-10-CM

## 2020-01-06 DIAGNOSIS — R0683 Snoring: Secondary | ICD-10-CM

## 2020-01-06 MED ORDER — METOLAZONE 5 MG PO TABS
5.0000 mg | ORAL_TABLET | ORAL | 1 refills | Status: DC
Start: 2020-01-06 — End: 2020-02-05

## 2020-01-06 NOTE — Progress Notes (Signed)
Cardiology Office Note:    Date:  01/06/2020   ID:  Oscar Kim, DOB 1940-12-08, MRN [id number]  PCP:  [name], NP  Cardiologist:  Berniece Salines, DO  Electrophysiologist:  None   Referring MD: [name], NP   Follow up visit  History of Present Illness:    Oscar Kim is a 79 y.o. male with a hx of coronary of artery disease status post stents 15 years ago, PAD status post stenting,chronic diastolic heart failure most recent ejection fraction 60 to 65%, frequent PVCwith multiple episodes of NSVT on monitor, hypertension, hyperlipidemia, diabetes mellitus and obesity.  I saw the patient on3/17/2021after he presented toprescribed vascular care due to his moved from [place] to [place]. At that time he reported that he was experiencing significant shortness of breath which is suspected was multifactorial. I did increase his Lasix to 60 mg daily and 40 mg in the p.m. he did get blood work.  Interim his lab work came back reporting significant BNP therefore I gave the patient metolazone to help with active diuresis. Days later the patient son called reporting that he was experiencing shortness of breath. Therefore patient was sent to the emergency department, he spent a few days at the hospital where he was treated for heart failure as well as acute kidney injury.  I saw the patient on August 22, 2019 at that time he was posthospitalization. We discussed his frequent PVCs and multiple episodes of NSVT. At that time I discussed with the patient who is agreeable to start amiodarone which did start the patient on amiodarone. He also had bilateral leg edema and had not been taking his Lasix therefore cautious diuretics at 20 mg daily.  He was seen on May 14,2021 at that time he reported to me that he had been experiencing significant fatigue.  Reports a vitamin D level, iron and B12 level and TSH.  His blood work reported vitamin D deficiency at which time but the  patient recommending that he take 50,000 units of vitamin D per week but he declined.  He is here today for a follow-up visit he tells me he that recently noted that he has had bilateral leg edema.  He also tells me that he has been significantly fatigue.  He notes that his snoring according to his family members is getting worse.   Past Medical History:  Diagnosis Date  . CAD (coronary artery disease)    Reported infarct and subsequent stent intervention approximately 15 years ago - [place]  . Chronic diastolic heart failure ([place]) 08/22/2019  . Chronic systolic CHF (congestive heart failure) ([place]) 07/23/2019  . Chronic systolic heart failure ([place])   . Coronary artery disease involving native coronary artery of native heart without angina pectoris 07/23/2019  . Dyspnea 07/23/2019  . Essential hypertension   . Fatigue 07/23/2019  . 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 Hyperlipidemia   . Mixed hyperlipidemia 07/23/2019  . NSVT (nonsustained ventricular tachycardia) ([place]) 08/22/2019  . Obesity (BMI 30-39.9)   . PAD (peripheral artery disease) (HCC)    Previous stent revascularization, left leg - [place]  . Type 2 diabetes mellitus ([place])   . Type 2 diabetes mellitus without complication, with long-term current use of insulin ([place]) 07/31/2019    Past Surgical History:  Procedure Laterality Date  . CORONARY ANGIOPLASTY WITH STENT PLACEMENT     When he lived in [place], multiple stents placed  . KNEE SURGERY Right     Current Medications: Current Meds  Medication  Sig  . allopurinol (ZYLOPRIM) 100 MG tablet Take 100 mg by mouth 2 (two) times daily.  Marland Kitchen amiodarone (PACERONE) 200 MG tablet Take 1 tablet (200 mg total) by mouth daily.  Marland Kitchen aspirin EC 81 MG tablet Take 81 mg by mouth daily.  Marland Kitchen atorvastatin (LIPITOR) 40 MG tablet Take 40 mg by mouth daily.  . carvedilol (COREG) 3.125 MG tablet Take 3.125 mg by mouth 2 (two) times daily.  . clopidogrel (PLAVIX) 75 MG tablet Take 75 mg by mouth  daily.  Marland Kitchen escitalopram (LEXAPRO) 5 MG tablet Take 5 mg by mouth daily.  . furosemide (LASIX) 40 MG tablet Take 60 mg by mouth every morning. And 40 mg in the evening  . gabapentin (NEURONTIN) 300 MG capsule Take 300 mg by mouth 2 (two) times daily.   Marland Kitchen glipiZIDE (GLUCOTROL XL) 5 MG 24 hr tablet Take 5 mg by mouth daily.  . methocarbamol (ROBAXIN) 500 MG tablet Take 500 mg by mouth 2 (two) times daily.  . metolazone (ZAROXOLYN) 5 MG tablet Take 5 mg by mouth daily.  . metoprolol succinate (TOPROL-XL) 25 MG 24 hr tablet Take 12.5 mg by mouth daily.  Marland Kitchen omeprazole (PRILOSEC) 40 MG capsule Take 40 mg by mouth daily.  . pantoprazole (PROTONIX) 40 MG tablet Take 1 tablet (40 mg total) by mouth daily.  . Potassium Chloride ER 20 MEQ TBCR Take 20 mEq by mouth 2 (two) times daily.  . potassium chloride SA (KLOR-CON) 20 MEQ tablet Take 1 tablet (20 mEq total) by mouth daily.  Marland Kitchen tiZANidine (ZANAFLEX) 2 MG tablet Take 2 mg by mouth 2 (two) times daily.  . traMADol (ULTRAM) 50 MG tablet Take 50 mg by mouth in the morning, at noon, and at bedtime.  . traZODone (DESYREL) 50 MG tablet Take 50 mg by mouth daily as needed.  Tyler Aas FLEXTOUCH 200 UNIT/ML FlexTouch Pen Inject 60 Units into the skin daily.  . Vitamin D, Ergocalciferol, (DRISDOL) 1.25 MG (50000 UNIT) CAPS capsule Take 1 capsule (50,000 Units total) by mouth every 7 (seven) days.     Allergies:   Patient has no known allergies.   Social History   Socioeconomic History  . Marital status: Married    Spouse name: Not on file  . Number of children: Not on file  . Years of education: Not on file  . Highest education level: Not on file  Occupational History  . Not on file  Tobacco Use  . Smoking status: Former Smoker    Types: Cigarettes    Quit date: 07/23/1979    Years since quitting: 40.4  . Smokeless tobacco: Current User    Types: Snuff  Vaping Use  . Vaping Use: Never used  Substance and Sexual Activity  . Alcohol use: Never  .  Drug use: Never  . Sexual activity: Not on file  Other Topics Concern  . Not on file  Social History Narrative  . Not on file   Social Determinants of Health   Financial Resource Strain:   . Difficulty of Paying Living Expenses: Not on file  Food Insecurity:   . Worried About Charity fundraiser in the Last Year: Not on file  . Ran Out of Food in the Last Year: Not on file  Transportation Needs:   . Lack of Transportation (Medical): Not on file  . Lack of Transportation (Non-Medical): Not on file  Physical Activity:   . Days of Exercise per Week: Not on file  . Minutes  of Exercise per Session: Not on file  Stress:   . Feeling of Stress : Not on file  Social Connections:   . Frequency of Communication with Friends and Family: Not on file  . Frequency of Social Gatherings with Friends and Family: Not on file  . Attends Religious Services: Not on file  . Active Member of Clubs or Organizations: Not on file  . Attends Archivist Meetings: Not on file  . Marital Status: Not on file     Family History: The patient's family history includes Hypertension in his father.  ROS:   Review of Systems  Constitution: Negative for decreased appetite, fever and weight gain.  HENT: Negative for congestion, ear discharge, hoarse voice and sore throat.   Eyes: Negative for discharge, redness, vision loss in right eye and visual halos.  Cardiovascular: Negative for chest pain, dyspnea on exertion, leg swelling, orthopnea and palpitations.  Respiratory: Negative for cough, hemoptysis, shortness of breath and snoring.   Endocrine: Negative for heat intolerance and polyphagia.  Hematologic/Lymphatic: Negative for bleeding problem. Does not bruise/bleed easily.  Skin: Negative for flushing, nail changes, rash and suspicious lesions.  Musculoskeletal: Negative for arthritis, joint pain, muscle cramps, myalgias, neck pain and stiffness.  Gastrointestinal: Negative for abdominal pain, bowel  incontinence, diarrhea and excessive appetite.  Genitourinary: Negative for decreased libido, genital sores and incomplete emptying.  Neurological: Negative for brief paralysis, focal weakness, headaches and loss of balance.  Psychiatric/Behavioral: Negative for altered mental status, depression and suicidal ideas.  Allergic/Immunologic: Negative for HIV exposure and persistent infections.    EKGs/Labs/Other Studies Reviewed:    The following studies were reviewed today:   EKG:  The ekg ordered today demonstrates   Recent Labs: 07/23/2019: NT-Pro BNP 1,962 07/29/2019: B Natriuretic Peptide 65.0 08/22/2019: ALT 14 09/19/2019: BUN 18; Creatinine, Ser 1.54; Hemoglobin 11.0; Magnesium 2.1; Platelets 195; Potassium 4.1; Sodium 138; TSH 3.180  Recent Lipid Panel    Component Value Date/Time   CHOL 237 (H) 07/31/2019 0256   TRIG 192 (H) 07/31/2019 0256   HDL 31 (L) 07/31/2019 0256   CHOLHDL 7.6 07/31/2019 0256   VLDL 38 07/31/2019 0256   LDLCALC 168 (H) 07/31/2019 0256    Physical Exam:    VS:  BP (!) 158/58   Pulse 70   Ht 5\' 7"  (1.702 m)   Wt 279 lb 6.4 oz (126.7 kg)   SpO2 98%   BMI 43.76 kg/m     Wt Readings from Last 3 Encounters:  01/06/20 279 lb 6.4 oz (126.7 kg)  10/20/19 208 lb (94.3 kg)  09/19/19 210 lb 12.8 oz (95.6 kg)     GEN: Well nourished, well developed in no acute distress HEENT: Normal NECK: No JVD; No carotid bruits LYMPHATICS: No lymphadenopathy CARDIAC: S1S2 noted,RRR, no murmurs, rubs, gallops RESPIRATORY:  Clear to auscultation without rales, wheezing or rhonchi  ABDOMEN: Soft, non-tender, non-distended, +bowel sounds, no guarding. EXTREMITIES: No edema, No cyanosis, no clubbing MUSCULOSKELETAL:  No deformity  SKIN: Warm and dry NEUROLOGIC:  Alert and oriented x 3, non-focal PSYCHIATRIC:  Normal affect, good insight  ASSESSMENT:    1. Frequent PVCs   2. Coronary artery disease involving native coronary artery of native heart without angina  pectoris   3. PAD (peripheral artery disease) ([place])   4. NSVT (nonsustained ventricular tachycardia) ([place])   5. Essential hypertension   6. Chronic diastolic heart failure ([place])   7. Type 2 diabetes mellitus without complication, with long-term current use of insulin (  [place])   8. Fatigue, unspecified type    PLAN:     He does have worsening bilateral leg edema as well as some weight gain.  He is on Lasix 60 mg in the morning and 40 mg at night.  I am going to add metolazone 5 mg once weekly where he will take this medication on Sundays 30 minutes before his first Lasix dosing.  He is in agreement with this.  For his fatigue and snoring I suspect the patient may have sleep apnea therefore I am recommending he undergo a sleep study.    The patient is in agreement with the above plan. The patient left the office in stable condition.  The patient will follow up in   Medication Adjustments/Labs and Tests Ordered: Current medicines are reviewed at length with the patient today.  Concerns regarding medicines are outlined above.  No orders of the defined types were placed in this encounter.  No orders of the defined types were placed in this encounter.   There are no Patient Instructions on file for this visit.   Adopting a Healthy Lifestyle.  Know what a healthy weight is for you (roughly BMI <25) and aim to maintain this   Aim for 7+ servings of fruits and vegetables daily   65-80+ fluid ounces of water or unsweet tea for healthy kidneys   Limit to max 1 drink of alcohol per day; avoid smoking/tobacco   Limit animal fats in diet for cholesterol and heart health - choose grass fed whenever available   Avoid highly processed foods, and foods high in saturated/trans fats   Aim for low stress - take time to unwind and care for your mental health   Aim for 150 min of moderate intensity exercise weekly for heart health, and weights twice weekly for bone health   Aim for 7-9 hours of  sleep daily   When it comes to diets, agreement about the perfect plan isnt easy to find, even among the experts. Experts at the [employer] developed an idea known as the Healthy Eating Plate. Just imagine a plate divided into logical, healthy portions.   The emphasis is on diet quality:   Load up on vegetables and fruits - one-half of your plate: Aim for color and variety, and remember that potatoes dont count.   Go for whole grains - one-quarter of your plate: Whole wheat, barley, wheat berries, quinoa, oats, brown rice, and foods made with them. If you want pasta, go with whole wheat pasta.   Protein power - one-quarter of your plate: Fish, chicken, beans, and nuts are all healthy, versatile protein sources. Limit red meat.   The diet, however, does go beyond the plate, offering a few other suggestions.   Use healthy plant oils, such as olive, canola, soy, corn, sunflower and peanut. Check the labels, and avoid partially hydrogenated oil, which have unhealthy trans fats.   If youre thirsty, drink water. Coffee and tea are good in moderation, but skip sugary drinks and limit milk and dairy products to one or two daily servings.   The type of carbohydrate in the diet is more important than the amount. Some sources of carbohydrates, such as vegetables, fruits, whole grains, and beans-are healthier than others.   Finally, stay active  Signed, Berniece Salines, DO  01/06/2020 3:38 PM     [facility]

## 2020-01-06 NOTE — Patient Instructions (Signed)
Medication Instructions:    TAKE METOLAZONE 5 MG ONCE A WEEK ON SUNDAYS 30 MINUTES BEFORE TAKING LASIX   *If you need a refill on your cardiac medications before your next appointment, please call your pharmacy*   Lab Work:  BMET AND [place]   If you have labs (blood work) drawn today and your tests are completely normal, you will receive your results only by: Marland Kitchen MyChart Message (if you have MyChart) OR . A paper copy in the mail If you have any lab test that is abnormal or we need to change your treatment, we will call you to review the results.   Testing/Procedures: Your physician has recommended that you have a sleep study. This test records several body functions during sleep, including: brain activity, eye movement, oxygen and carbon dioxide blood levels, heart rate and rhythm, breathing rate and rhythm, the flow of air through your mouth and nose, snoring, body muscle movements, and chest and belly movement. ( SOMEONE WILL CONTACT YOU FROM THIS DEPARTMENT FOR FURTHER INSTRUCTIONS)   Follow-Up: At [facility], you and your health needs are our priority.  As part of our continuing mission to provide you with exceptional heart care, we have created designated Provider Care Teams.  These Care Teams include your primary Cardiologist (physician) and Advanced Practice Providers (APPs -  Physician Assistants and Nurse Practitioners) who all work together to provide you with the care you need, when you need it.  We recommend signing up for the patient portal called "MyChart".  Sign up information is provided on this After Visit Summary.  MyChart is used to connect with patients for Virtual Visits (Telemedicine).  Patients are able to view lab/test results, encounter notes, upcoming appointments, etc.  Non-urgent messages can be sent to your provider as well.   To learn more about what you can do with MyChart, go to [email or web address].    Your next appointment:   1 month(s)  The  format for your next appointment:   In Person  Provider:   You will see Berniece Salines, DO.  Or, you can be scheduled with the following Advanced Practice Provider on your designated Care Team (at our [facility]):  Laurann [place], FNP     Other Instructions

## 2020-01-07 ENCOUNTER — Telehealth: Payer: Self-pay | Admitting: *Deleted

## 2020-01-07 ENCOUNTER — Telehealth: Payer: Self-pay

## 2020-01-07 DIAGNOSIS — R5383 Other fatigue: Secondary | ICD-10-CM

## 2020-01-07 DIAGNOSIS — R0683 Snoring: Secondary | ICD-10-CM

## 2020-01-07 LAB — BASIC METABOLIC PANEL
BUN/Creatinine Ratio: 14 (ref 10–24)
BUN: 18 mg/dL (ref 8–27)
CO2: 20 mmol/L (ref 20–29)
Calcium: 8.7 mg/dL (ref 8.6–10.2)
Chloride: 111 mmol/L — ABNORMAL HIGH (ref 96–106)
Creatinine, Ser: 1.26 mg/dL (ref 0.76–1.27)
GFR calc Af Amer: 63 mL/min/{1.73_m2} (ref >59)
GFR calc non Af Amer: 55 mL/min/{1.73_m2} — ABNORMAL LOW (ref >59)
Glucose: 55 mg/dL — ABNORMAL LOW (ref 65–99)
Potassium: 4.5 mmol/L (ref 3.5–5.2)
Sodium: 143 mmol/L (ref 134–144)

## 2020-01-07 LAB — MAGNESIUM: Magnesium: 2.1 mg/dL (ref 1.6–2.3)

## 2020-01-07 NOTE — Telephone Encounter (Signed)
Spoke with patient regarding results and recommendation.  Patient verbalizes understanding and is agreeable to plan of care. Advised patient to call back with any issues or concerns.   Patients blood sugar at this time was 204.

## 2020-01-07 NOTE — Telephone Encounter (Signed)
-----   Message from Claude Manges, [place] sent at 01/06/2020  3:40 PM EDT ----- Regarding: PT NEEDS SLEEP STUDY

## 2020-01-07 NOTE — Telephone Encounter (Signed)
-----   Message from Berniece Salines, DO sent at 01/07/2020  9:26 AM EDT ----- His creatinine function has gotten remarkably better.  He was 1.26, 3 months ago he was 1.54.  Unfortunately his blood glucose was 55.  He may need to see his PCP to readjust his insulin regimen.  But in the meantime please have the patient take his blood glucose at home if this is less than 65 he needs to go to the emergency department or his PCP office.

## 2020-01-08 ENCOUNTER — Telehealth: Payer: Self-pay | Admitting: Cardiology

## 2020-01-08 ENCOUNTER — Telehealth: Payer: Self-pay

## 2020-01-08 MED ORDER — FUROSEMIDE 40 MG PO TABS
60.0000 mg | ORAL_TABLET | Freq: Every morning | ORAL | 3 refills | Status: DC
Start: 2020-01-08 — End: 2020-02-05

## 2020-01-08 MED ORDER — POTASSIUM CHLORIDE ER 20 MEQ PO TBCR: 20.0000 meq | EXTENDED_RELEASE_TABLET | Freq: Two times a day (BID) | ORAL | 3 refills | Status: DC

## 2020-01-08 NOTE — Telephone Encounter (Signed)
Per [employer]/Med[employer] no PA required for Split Night Pt OK to schedule Call Ref # - [id number]  Sent to Gershon Cull, CMA to [place]

## 2020-01-08 NOTE — Telephone Encounter (Signed)
Pt c/o medication issue:  1. Name of Medication: patient unsure  2. How are you currently taking this medication (dosage and times per day)? N/A  3. Are you having a reaction (difficulty breathing--STAT)? N/A  4. What is your medication issue? Patient states his fluid pill, discussed during appointment on 01/06/20, has not been sent to the pharmacy. Patient unsure of the name of the medication.

## 2020-01-08 NOTE — Telephone Encounter (Signed)
Spoke to the patient just now and he let me know that he was suppose to get a refill on two of his medications that were not sent in during his recent visit. I sent these medications in for him at this time and per Dr. Terrial Rhodes verbal ok.

## 2020-01-09 ENCOUNTER — Telehealth: Payer: Self-pay | Admitting: *Deleted

## 2020-01-09 NOTE — Telephone Encounter (Signed)
-----   Message from Bobby Rumpf, [place] sent at 01/08/2020 12:44 PM EDT ----- Regarding: Split Night Per [employer]/Medicaid no PA required for Split Night Pt OK to schedule Call Ref # - [id number] ----- Message ----- From: Claude Manges, CMA Sent: 01/06/2020   3:40 PM EDT To: Freada Bergeron, CMA, Cv Div Sleep Studies Subject: PT NEEDS SLEEP STUDY

## 2020-01-09 NOTE — Addendum Note (Signed)
Addended by: Freada Bergeron on: 01/09/2020 05:17 PM   Modules accepted: Level of Service, SmartSet

## 2020-01-09 NOTE — Telephone Encounter (Signed)
This encounter was created in error - please disregard.

## 2020-01-09 NOTE — Telephone Encounter (Signed)
-----   Message from Bobby Rumpf, [place] sent at 01/08/2020 12:44 PM EDT ----- Regarding: Split Night Per [employer]/Medicaid no PA required for Split Night Pt OK to schedule Call Ref # - [id number] ----- Message ----- From: Claude Manges, CMA Sent: 01/06/2020   3:40 PM EDT To: Freada Bergeron, CMA, Cv Div Sleep Studies Subject: PT NEEDS SLEEP STUDY

## 2020-01-09 NOTE — Telephone Encounter (Signed)
Patient has refused the in lab study and states he asked for a home sleep study. Is home sleep study ok?

## 2020-01-10 NOTE — Telephone Encounter (Signed)
That will be fine. 

## 2020-01-20 ENCOUNTER — Other Ambulatory Visit: Payer: Medicare Other | Admitting: Orthotics

## 2020-01-20 ENCOUNTER — Other Ambulatory Visit: Payer: Self-pay

## 2020-01-23 DIAGNOSIS — I340 Nonrheumatic mitral (valve) insufficiency: Secondary | ICD-10-CM | POA: Diagnosis not present

## 2020-01-23 DIAGNOSIS — I361 Nonrheumatic tricuspid (valve) insufficiency: Secondary | ICD-10-CM | POA: Diagnosis not present

## 2020-01-26 ENCOUNTER — Ambulatory Visit: Payer: Medicare Other | Admitting: Cardiology

## 2020-02-04 DIAGNOSIS — E119 Type 2 diabetes mellitus without complications: Secondary | ICD-10-CM | POA: Insufficient documentation

## 2020-02-04 DIAGNOSIS — E669 Obesity, unspecified: Secondary | ICD-10-CM | POA: Insufficient documentation

## 2020-02-04 DIAGNOSIS — E785 Hyperlipidemia, unspecified: Secondary | ICD-10-CM | POA: Insufficient documentation

## 2020-02-04 DIAGNOSIS — I2510 Atherosclerotic heart disease of native coronary artery without angina pectoris: Secondary | ICD-10-CM | POA: Insufficient documentation

## 2020-02-04 DIAGNOSIS — I5022 Chronic systolic (congestive) heart failure: Secondary | ICD-10-CM | POA: Insufficient documentation

## 2020-02-05 ENCOUNTER — Other Ambulatory Visit: Payer: Self-pay

## 2020-02-05 ENCOUNTER — Encounter: Payer: Self-pay | Admitting: Cardiology

## 2020-02-05 ENCOUNTER — Ambulatory Visit (INDEPENDENT_AMBULATORY_CARE_PROVIDER_SITE_OTHER): Payer: Medicare Other | Admitting: Cardiology

## 2020-02-05 VITALS — BP 142/68 | HR 68 | Ht 70.0 in | Wt 208.2 lb

## 2020-02-05 DIAGNOSIS — E782 Mixed hyperlipidemia: Secondary | ICD-10-CM

## 2020-02-05 DIAGNOSIS — I2510 Atherosclerotic heart disease of native coronary artery without angina pectoris: Secondary | ICD-10-CM

## 2020-02-05 DIAGNOSIS — E669 Obesity, unspecified: Secondary | ICD-10-CM

## 2020-02-05 DIAGNOSIS — I493 Ventricular premature depolarization: Secondary | ICD-10-CM | POA: Diagnosis not present

## 2020-02-05 DIAGNOSIS — Z79899 Other long term (current) drug therapy: Secondary | ICD-10-CM

## 2020-02-05 DIAGNOSIS — I4729 Other ventricular tachycardia: Secondary | ICD-10-CM

## 2020-02-05 DIAGNOSIS — I739 Peripheral vascular disease, unspecified: Secondary | ICD-10-CM | POA: Diagnosis not present

## 2020-02-05 DIAGNOSIS — I472 Ventricular tachycardia: Secondary | ICD-10-CM

## 2020-02-05 DIAGNOSIS — I5032 Chronic diastolic (congestive) heart failure: Secondary | ICD-10-CM

## 2020-02-05 DIAGNOSIS — I10 Essential (primary) hypertension: Secondary | ICD-10-CM

## 2020-02-05 NOTE — Progress Notes (Signed)
Cardiology Office Note:    Date:  02/05/2020   ID:  Oscar Kim, DOB 13-Apr-1941, MRN [id number]  PCP:  Oscar Sewer, NP  Cardiologist:  Oscar Salines, DO  Electrophysiologist:  None   Referring MD: Oscar Sewer, NP   Follow-up visit  History of Present Illness:    Oscar Kim a 79 y.o.malewith a hx of coronaryof artery disease status post stents 15 years ago, PAD status post stenting,chronic diastolic heart failure most recent ejection fraction 60 to 65%, frequent PVCwith multiple episodes of NSVT on monitor, hypertension, hyperlipidemia, diabetes mellitus and obesity.  I saw the patient on January 06, 2020 at that time he did have worsening leg edema so I added Zaroxolyn 5 mg weekly to his diuretic regimen to take 30 minutes on Sundays.  He has been taking his medicines.  But recently the patient was admitted at the [facility] for observation due to orthostatic hypotension.  The Zaroxolyn was stopped I agree with this.  Given that he did also have orthostatic vitals are positive his Lasix was dropped to 40 mg twice a day.  Also recommend a sleep study however the patient declined the sleep study.  He offers no complaints at this time.  Past Medical History:  Diagnosis Date  . CAD (coronary artery disease)    Reported infarct and subsequent stent intervention approximately 15 years ago - [place]  . Chronic diastolic heart failure ([place]) 08/22/2019  . Chronic systolic CHF (congestive heart failure) ([place]) 07/23/2019  . Chronic systolic heart failure ([place])   . Coronary artery disease involving native coronary artery of native heart without angina pectoris 07/23/2019  . Dyspnea 07/23/2019  . Essential hypertension   . Fatigue 07/23/2019  . 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 Hyperlipidemia   . Mixed hyperlipidemia 07/23/2019  . NSVT (nonsustained ventricular tachycardia) ([place]) 08/22/2019  . Obesity (BMI 30-39.9)   . Other general symptoms and signs  09/19/2019  . PAD  (peripheral artery disease) ([facility])    Previous stent revascularization, left leg - [place]  . Type 2 diabetes mellitus ([place])   . Type 2 diabetes mellitus without complication, with long-term current use of insulin ([place]) 07/31/2019  . Vitamin D deficiency, unspecified  09/19/2019    Past Surgical History:  Procedure Laterality Date  . CORONARY ANGIOPLASTY WITH STENT PLACEMENT     When he lived in [place], multiple stents placed  . KNEE SURGERY Right     Current Medications: Current Meds  Medication Sig  . allopurinol (ZYLOPRIM) 100 MG tablet Take 100 mg by mouth 2 (two) times daily.  Marland Kitchen amiodarone (PACERONE) 200 MG tablet Take 1 tablet (200 mg total) by mouth daily.  Marland Kitchen aspirin EC 81 MG tablet Take 81 mg by mouth daily.  Marland Kitchen atorvastatin (LIPITOR) 40 MG tablet Take 40 mg by mouth daily.  . carvedilol (COREG) 3.125 MG tablet Take 3.125 mg by mouth 2 (two) times daily.  . clopidogrel (PLAVIX) 75 MG tablet Take 75 mg by mouth daily.  Marland Kitchen escitalopram (LEXAPRO) 5 MG tablet Take 5 mg by mouth daily.  . furosemide (LASIX) 40 MG tablet Take 40 mg by mouth daily. And 40 mg in the evening  . gabapentin (NEURONTIN) 300 MG capsule Take 300 mg by mouth 2 (two) times daily.   Marland Kitchen glipiZIDE (GLUCOTROL XL) 5 MG 24 hr tablet Take 5 mg by mouth daily.  . methocarbamol (ROBAXIN) 500 MG tablet Take 500 mg by mouth 2 (two) times daily.  Marland Kitchen omeprazole (PRILOSEC) 40 MG  capsule Take 40 mg by mouth daily.  . pantoprazole (PROTONIX) 40 MG tablet Take 1 tablet (40 mg total) by mouth daily.  . Potassium Chloride ER 20 MEQ TBCR Take 20 mEq by mouth 2 (two) times daily.  Marland Kitchen tiZANidine (ZANAFLEX) 2 MG tablet Take 2 mg by mouth 2 (two) times daily.  Tyler Aas FLEXTOUCH 200 UNIT/ML FlexTouch Pen Inject 45 Units into the skin daily.      Allergies:   Patient has no known allergies.   Social History   Socioeconomic History  . Marital status: Married    Spouse name: Not on file  . Number of children: Not on  file  . Years of education: Not on file  . Highest education level: Not on file  Occupational History  . Not on file  Tobacco Use  . Smoking status: Former Smoker    Types: Cigarettes    Quit date: 07/23/1979    Years since quitting: 40.5  . Smokeless tobacco: Current User    Types: Snuff  Vaping Use  . Vaping Use: Never used  Substance and Sexual Activity  . Alcohol use: Never  . Drug use: Never  . Sexual activity: Not on file  Other Topics Concern  . Not on file  Social History Narrative  . Not on file   Social Determinants of Health   Financial Resource Strain:   . Difficulty of Paying Living Expenses: Not on file  Food Insecurity:   . Worried About Charity fundraiser in the Last Year: Not on file  . Ran Out of Food in the Last Year: Not on file  Transportation Needs:   . Lack of Transportation (Medical): Not on file  . Lack of Transportation (Non-Medical): Not on file  Physical Activity:   . Days of Exercise per Week: Not on file  . Minutes of Exercise per Session: Not on file  Stress:   . Feeling of Stress : Not on file  Social Connections:   . Frequency of Communication with Friends and Family: Not on file  . Frequency of Social Gatherings with Friends and Family: Not on file  . Attends Religious Services: Not on file  . Active Member of Clubs or Organizations: Not on file  . Attends Archivist Meetings: Not on file  . Marital Status: Not on file     Family History: The patient's family history includes Hypertension in his father.  ROS:   Review of Systems  Constitution: Negative for decreased appetite, fever and weight gain.  HENT: Negative for congestion, ear discharge, hoarse voice and sore throat.   Eyes: Negative for discharge, redness, vision loss in right eye and visual halos.  Cardiovascular: Negative for chest pain, dyspnea on exertion, leg swelling, orthopnea and palpitations.  Respiratory: Negative for cough, hemoptysis, shortness of  breath and snoring.   Endocrine: Negative for heat intolerance and polyphagia.  Hematologic/Lymphatic: Negative for bleeding problem. Does not bruise/bleed easily.  Skin: Negative for flushing, nail changes, rash and suspicious lesions.  Musculoskeletal: Negative for arthritis, joint pain, muscle cramps, myalgias, neck pain and stiffness.  Gastrointestinal: Negative for abdominal pain, bowel incontinence, diarrhea and excessive appetite.  Genitourinary: Negative for decreased libido, genital sores and incomplete emptying.  Neurological: Negative for brief paralysis, focal weakness, headaches and loss of balance.  Psychiatric/Behavioral: Negative for altered mental status, depression and suicidal ideas.  Allergic/Immunologic: Negative for HIV exposure and persistent infections.    EKGs/Labs/Other Studies Reviewed:    The following studies were  reviewed today:   EKG:  The ekg ordered today demonstrates   Recent Labs: 07/23/2019: NT-Pro BNP 1,962 07/29/2019: B Natriuretic Peptide 65.0 08/22/2019: ALT 14 09/19/2019: Hemoglobin 11.0; Platelets 195; TSH 3.180 01/06/2020: BUN 18; Creatinine, Ser 1.26; Magnesium 2.1; Potassium 4.5; Sodium 143  Recent Lipid Panel    Component Value Date/Time   CHOL 237 (H) 07/31/2019 0256   TRIG 192 (H) 07/31/2019 0256   HDL 31 (L) 07/31/2019 0256   CHOLHDL 7.6 07/31/2019 0256   VLDL 38 07/31/2019 0256   LDLCALC 168 (H) 07/31/2019 0256    Physical Exam:    VS:  BP (!) 142/68   Pulse 68   Ht 5\' 10"  (1.778 m)   Wt 208 lb 3.2 oz (94.4 kg)   SpO2 96%   BMI 29.87 kg/m     Wt Readings from Last 3 Encounters:  02/05/20 208 lb 3.2 oz (94.4 kg)  01/06/20 279 lb 6.4 oz (126.7 kg)  10/20/19 208 lb (94.3 kg)     GEN: Well nourished, well developed in no acute distress HEENT: Normal NECK: No JVD; No carotid bruits LYMPHATICS: No lymphadenopathy CARDIAC: S1S2 noted,RRR, no murmurs, rubs, gallops RESPIRATORY:  Clear to auscultation without rales, wheezing  or rhonchi  ABDOMEN: Soft, non-tender, non-distended, +bowel sounds, no guarding. EXTREMITIES: No edema, No cyanosis, no clubbing MUSCULOSKELETAL:  No deformity  SKIN: Warm and dry NEUROLOGIC:  Alert and oriented x 3, non-focal PSYCHIATRIC:  Normal affect, good insight  ASSESSMENT:    1. Medication management   2. Frequent PVCs   3. Coronary artery disease involving native coronary artery of native heart without angina pectoris   4. PAD (peripheral artery disease) ([place])   5. Essential hypertension   6. NSVT (nonsustained ventricular tachycardia) (HCC)   7. Chronic diastolic heart failure ([place])   8. Mixed hyperlipidemia   9. Obesity (BMI 30-39.9)    PLAN:     1.  His left edema has significantly improved.  We'll keep him on Lasix 40 mg twice a day for now.  I will get with him stopping the Zaroxolyn as he does not need this medication for diuretics.  2.  His EKG showed evidence of sinus rhythm with inferolateral T wave inversion, he does not have any signs of angina at this time we'll continue to monitor the patient closely.  We'll continue his aspirin, Plavix and his atorvastatin 40 mg daily./  3.  Diabetes per his PCP.  He is currently on glipizide and [place].  4.  His blood pressure is slightly elevated but given his recent orthostatic hypotension I will continue to monitor this will not change antihypertensive for now.  The patient is in agreement with the above plan. The patient left the office in stable condition.  The patient will follow up in 3 months or sooner if needed.   Medication Adjustments/Labs and Tests Ordered: Current medicines are reviewed at length with the patient today.  Concerns regarding medicines are outlined above.  Orders Placed This Encounter  Procedures  . Basic metabolic panel  . Magnesium  . CBC  . EKG 12-Lead   No orders of the defined types were placed in this encounter.   Patient Instructions  Medication Instructions:  Your physician  recommends that you continue on your current medications as directed. Please refer to the Current Medication list given to you today.  *If you need a refill on your cardiac medications before your next appointment, please call your pharmacy*   Lab Work: [place], [place],  Cbc- Today   If you have labs (blood work) drawn today and your tests are completely normal, you will receive your results only by: Marland Kitchen MyChart Message (if you have MyChart) OR . A paper copy in the mail If you have any lab test that is abnormal or we need to change your treatment, we will call you to review the results.   Testing/Procedures: None ordered    Follow-Up: At [facility], you and your health needs are our priority.  As part of our continuing mission to provide you with exceptional heart care, we have created designated Provider Care Teams.  These Care Teams include your primary Cardiologist (physician) and Advanced Practice Providers (APPs -  Physician Assistants and Nurse Practitioners) who all work together to provide you with the care you need, when you need it.  We recommend signing up for the patient portal called "MyChart".  Sign up information is provided on this After Visit Summary.  MyChart is used to connect with patients for Virtual Visits (Telemedicine).  Patients are able to view lab/test results, encounter notes, upcoming appointments, etc.  Non-urgent messages can be sent to your provider as well.   To learn more about what you can do with MyChart, go to [email or web address].    Your next appointment:   3 months   The format for your next appointment:   In Person  Provider:   Berniece Salines, DO   Other Instructions None      Adopting a Healthy Lifestyle.  Know what a healthy weight is for you (roughly BMI <25) and aim to maintain this   Aim for 7+ servings of fruits and vegetables daily   65-80+ fluid ounces of water or unsweet tea for healthy kidneys   Limit to max 1 drink of  alcohol per day; avoid smoking/tobacco   Limit animal fats in diet for cholesterol and heart health - choose grass fed whenever available   Avoid highly processed foods, and foods high in saturated/trans fats   Aim for low stress - take time to unwind and care for your mental health   Aim for 150 min of moderate intensity exercise weekly for heart health, and weights twice weekly for bone health   Aim for 7-9 hours of sleep daily   When it comes to diets, agreement about the perfect plan isnt easy to find, even among the experts. Experts at the [place] developed an idea known as the Healthy Eating Plate. Just imagine a plate divided into logical, healthy portions.   The emphasis is on diet quality:   Load up on vegetables and fruits - one-half of your plate: Aim for color and variety, and remember that potatoes dont count.   Go for whole grains - one-quarter of your plate: Whole wheat, barley, wheat berries, quinoa, oats, brown rice, and foods made with them. If you want pasta, go with whole wheat pasta.   Protein power - one-quarter of your plate: Fish, chicken, beans, and nuts are all healthy, versatile protein sources. Limit red meat.   The diet, however, does go beyond the plate, offering a few other suggestions.   Use healthy plant oils, such as olive, canola, soy, corn, sunflower and peanut. Check the labels, and avoid partially hydrogenated oil, which have unhealthy trans fats.   If youre thirsty, drink water. Coffee and tea are good in moderation, but skip sugary drinks and limit milk and dairy products to one or two daily servings.  The type of carbohydrate in the diet is more important than the amount. Some sources of carbohydrates, such as vegetables, fruits, whole grains, and beans-are healthier than others.   Finally, stay active  Signed, Oscar Salines, DO  02/05/2020 10:00 AM    [place]

## 2020-02-05 NOTE — Patient Instructions (Addendum)
Medication Instructions:  Your physician recommends that you continue on your current medications as directed. Please refer to the Current Medication list given to you today.  *If you need a refill on your cardiac medications before your next appointment, please call your pharmacy*   Lab Work: Bmp, mag, Cbc- Today   If you have labs (blood work) drawn today and your tests are completely normal, you will receive your results only by: Marland Kitchen MyChart Message (if you have MyChart) OR . A paper copy in the mail If you have any lab test that is abnormal or we need to change your treatment, we will call you to review the results.   Testing/Procedures: None ordered    Follow-Up: At [facility], you and your health needs are our priority.  As part of our continuing mission to provide you with exceptional heart care, we have created designated Provider Care Teams.  These Care Teams include your primary Cardiologist (physician) and Advanced Practice Providers (APPs -  Physician Assistants and Nurse Practitioners) who all work together to provide you with the care you need, when you need it.  We recommend signing up for the patient portal called "MyChart".  Sign up information is provided on this After Visit Summary.  MyChart is used to connect with patients for Virtual Visits (Telemedicine).  Patients are able to view lab/test results, encounter notes, upcoming appointments, etc.  Non-urgent messages can be sent to your provider as well.   To learn more about what you can do with MyChart, go to [email or web address].    Your next appointment:   3 months   The format for your next appointment:   In Person  Provider:   Berniece Salines, DO   Other Instructions None

## 2020-02-06 ENCOUNTER — Telehealth: Payer: Self-pay

## 2020-02-06 ENCOUNTER — Telehealth: Payer: Self-pay | Admitting: Cardiology

## 2020-02-06 LAB — CBC
Hematocrit: 34.6 % — ABNORMAL LOW (ref 37.5–51.0)
Hemoglobin: 11.7 g/dL — ABNORMAL LOW (ref 13.0–17.7)
MCH: 28.7 pg (ref 26.6–33.0)
MCHC: 33.8 g/dL (ref 31.5–35.7)
MCV: 85 fL (ref 79–97)
Platelets: 216 10*3/uL (ref 150–450)
RBC: 4.07 x10E6/uL — ABNORMAL LOW (ref 4.14–5.80)
RDW: 14.4 % (ref 11.6–15.4)
WBC: 7.9 10*3/uL (ref 3.4–10.8)

## 2020-02-06 LAB — BASIC METABOLIC PANEL
BUN/Creatinine Ratio: 14 (ref 10–24)
BUN: 24 mg/dL (ref 8–27)
CO2: 28 mmol/L (ref 20–29)
Calcium: 9.3 mg/dL (ref 8.6–10.2)
Chloride: 100 mmol/L (ref 96–106)
Creatinine, Ser: 1.68 mg/dL — ABNORMAL HIGH (ref 0.76–1.27)
GFR calc Af Amer: 45 mL/min/{1.73_m2} — ABNORMAL LOW (ref >59)
GFR calc non Af Amer: 39 mL/min/{1.73_m2} — ABNORMAL LOW (ref >59)
Glucose: 119 mg/dL — ABNORMAL HIGH (ref 65–99)
Potassium: 4.4 mmol/L (ref 3.5–5.2)
Sodium: 140 mmol/L (ref 134–144)

## 2020-02-06 LAB — MAGNESIUM: Magnesium: 2 mg/dL (ref 1.6–2.3)

## 2020-02-06 NOTE — Telephone Encounter (Signed)
Patient is returning call to discuss results from lab work completed on 02/05/20.

## 2020-02-06 NOTE — Telephone Encounter (Signed)
-----   Message from Berniece Salines, DO sent at 02/06/2020  8:13 AM EDT ----- Creatinine same as 4 months ago we will continue to monitor your kidney function on your diuretics.  Blood glucose has improved

## 2020-02-06 NOTE — Telephone Encounter (Signed)
Spoke with patient regarding results and recommendation.  Patient verbalizes understanding and is agreeable to plan of care. Advised patient to call back with any issues or concerns.  

## 2020-02-11 ENCOUNTER — Encounter (HOSPITAL_BASED_OUTPATIENT_CLINIC_OR_DEPARTMENT_OTHER): Payer: Medicare Other | Admitting: Cardiology

## 2020-03-01 ENCOUNTER — Encounter: Payer: Self-pay | Admitting: Orthopaedic Surgery

## 2020-03-01 ENCOUNTER — Other Ambulatory Visit: Payer: Self-pay

## 2020-03-01 ENCOUNTER — Ambulatory Visit (INDEPENDENT_AMBULATORY_CARE_PROVIDER_SITE_OTHER): Payer: Medicare Other | Admitting: Orthopaedic Surgery

## 2020-03-01 ENCOUNTER — Other Ambulatory Visit: Payer: Self-pay | Admitting: Cardiology

## 2020-03-01 DIAGNOSIS — M1711 Unilateral primary osteoarthritis, right knee: Secondary | ICD-10-CM | POA: Diagnosis not present

## 2020-03-01 MED ORDER — HYALURONAN 88 MG/4ML IX SOSY
88.0000 mg | PREFILLED_SYRINGE | INTRA_ARTICULAR | Status: AC | PRN
  Administered 2020-03-01: 88 mg via INTRA_ARTICULAR

## 2020-03-01 NOTE — Progress Notes (Signed)
   Procedure Note  Patient: Oscar Kim             Date of Birth: 12-19-1940           MRN: [id number]             Visit Date: 03/01/2020  Procedures: Visit Diagnoses:  1. Primary osteoarthritis of right knee     Large Joint Inj on 03/01/2020 11:30 AM Indications: diagnostic evaluation and pain Details: 22 G 1.5 in needle, superolateral approach  Arthrogram: No  Medications: 88 mg Hyaluronan 88 MG/4ML Outcome: tolerated well, no immediate complications Procedure, treatment alternatives, risks and benefits explained, specific risks discussed. Consent was given by the patient. Immediately prior to procedure a time out was called to verify the correct patient, procedure, equipment, support staff and site/side marked as required. Patient was prepped and draped in the usual sterile fashion.    The patient is here today for scheduled hyaluronic acid injection with Monovisc in the right knee to treat pain from osteoarthritis.  He has had injections like this in the past.  He has had no other acute changes in medical status.  He understands fully why we are doing this today.  Examination of his knee does show just a mild effusion.  There is varus malalignment of the right knee with painful arc of motion.  There is medial and lateral joint line tenderness as well as patellofemoral crepitation.  I did place Monovisc in his right knee without difficulty.  All questions and concerns were answered and addressed.  Follow-up is as needed.

## 2020-03-03 NOTE — Addendum Note (Signed)
Addended by: Freada Bergeron on: 03/03/2020 01:59 PM   Modules accepted: Orders

## 2020-03-05 NOTE — Telephone Encounter (Signed)
Patient is aware and agreeable to Home Sleep Study through [facility]. Patient is scheduled for 03/24/20 at 2:30 to pick up home sleep kit and meet with Respiratory therapist at [facility]. Patient is aware that if this appointment date and time does not work for them they should contact Artis Delay directly at [phone]. Patient is aware that a sleep packet will be sent from [facility] in week. Patient is agreeable to treatment and thankful for call.

## 2020-03-16 ENCOUNTER — Ambulatory Visit (INDEPENDENT_AMBULATORY_CARE_PROVIDER_SITE_OTHER): Payer: Medicare Other | Admitting: Sports Medicine

## 2020-03-16 ENCOUNTER — Encounter: Payer: Self-pay | Admitting: Sports Medicine

## 2020-03-16 ENCOUNTER — Other Ambulatory Visit: Payer: Self-pay

## 2020-03-16 DIAGNOSIS — M79675 Pain in left toe(s): Secondary | ICD-10-CM | POA: Diagnosis not present

## 2020-03-16 DIAGNOSIS — S90414A Abrasion, right lesser toe(s), initial encounter: Secondary | ICD-10-CM

## 2020-03-16 DIAGNOSIS — Z794 Long term (current) use of insulin: Secondary | ICD-10-CM

## 2020-03-16 DIAGNOSIS — M79674 Pain in right toe(s): Secondary | ICD-10-CM

## 2020-03-16 DIAGNOSIS — E119 Type 2 diabetes mellitus without complications: Secondary | ICD-10-CM

## 2020-03-16 DIAGNOSIS — S90412A Abrasion, left great toe, initial encounter: Secondary | ICD-10-CM

## 2020-03-16 DIAGNOSIS — B351 Tinea unguium: Secondary | ICD-10-CM | POA: Diagnosis not present

## 2020-03-16 DIAGNOSIS — I739 Peripheral vascular disease, unspecified: Secondary | ICD-10-CM

## 2020-03-16 NOTE — Progress Notes (Signed)
Subjective: Oscar Kim is a 79 y.o. male patient with history of diabetes presents to office for diabetic nail care. Patient denies any pedal complaints since last encounter. No other issues noted.  Fasting blood sugar 160.   Patient Active Problem List   Diagnosis Date Noted  . CAD (coronary artery disease)   . Chronic systolic heart failure ([place])   . Hyperlipidemia   . Obesity (BMI 30-39.9)   . Type 2 diabetes mellitus ([place])   . Other general symptoms and signs  09/19/2019  . Vitamin D deficiency, unspecified  09/19/2019  . NSVT (nonsustained ventricular tachycardia) ([place]) 08/22/2019  . Chronic diastolic heart failure ([place]) 08/22/2019  . Type 2 diabetes mellitus without complication, with long-term current use of insulin ([place]) 07/31/2019  . 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 Chronic systolic CHF (congestive heart failure) ([place]) 07/23/2019  . Dyspnea 07/23/2019  . Fatigue 07/23/2019  . Coronary artery disease involving native coronary artery of native heart without angina pectoris 07/23/2019  . PAD (peripheral artery disease) ([place]) 07/23/2019  . Mixed hyperlipidemia 07/23/2019  . Essential hypertension 07/23/2019   Current Outpatient Medications on File Prior to Visit  Medication Sig Dispense Refill  . allopurinol (ZYLOPRIM) 100 MG tablet Take 100 mg by mouth 2 (two) times daily.    Marland Kitchen amiodarone (PACERONE) 200 MG tablet Take 1 tablet (200 mg total) by mouth daily. 90 tablet 3  . aspirin EC 81 MG tablet Take 81 mg by mouth daily.    Marland Kitchen atorvastatin (LIPITOR) 40 MG tablet Take 40 mg by mouth daily.    . carvedilol (COREG) 3.125 MG tablet Take 3.125 mg by mouth 2 (two) times daily.    . clopidogrel (PLAVIX) 75 MG tablet Take 75 mg by mouth daily.    Marland Kitchen escitalopram (LEXAPRO) 5 MG tablet Take 5 mg by mouth daily.    . furosemide (LASIX) 40 MG tablet Take 40 mg by mouth daily. And 40 mg in the evening    . gabapentin (NEURONTIN) 300 MG capsule Take 300 mg by mouth 2 (two) times daily.     Marland Kitchen  glipiZIDE (GLUCOTROL XL) 5 MG 24 hr tablet Take 5 mg by mouth daily.    . methocarbamol (ROBAXIN) 500 MG tablet Take 500 mg by mouth 2 (two) times daily.    . metoprolol succinate (TOPROL-XL) 25 MG 24 hr tablet Take 12.5 mg by mouth daily. (Patient not taking: Reported on 02/05/2020)    . omeprazole (PRILOSEC) 40 MG capsule Take 40 mg by mouth daily.    . pantoprazole (PROTONIX) 40 MG tablet Take 1 tablet (40 mg total) by mouth daily. 30 tablet 1  . Potassium Chloride ER 20 MEQ TBCR Take 20 mEq by mouth 2 (two) times daily. 60 tablet 3  . tiZANidine (ZANAFLEX) 2 MG tablet Take 2 mg by mouth 2 (two) times daily.    Tyler Aas FLEXTOUCH 200 UNIT/ML FlexTouch Pen Inject 45 Units into the skin daily.      No current facility-administered medications on file prior to visit.   No Known Allergies  Recent Results (from the past 2160 hour(s))  Basic Metabolic Panel (BMET)     Status: Abnormal   Collection Time: 01/06/20  3:46 PM  Result Value Ref Range   Glucose 55 (L) 65 - 99 mg/dL   BUN 18 8 - 27 mg/dL   Creatinine, Ser 1.26 0.76 - 1.27 mg/dL   GFR calc non Af Amer 55 (L) >59 mL/min/1.73   GFR calc Af Amer 63 >  59 mL/min/1.73    Comment: **[facility] currently reports eGFR in compliance with the current**   recommendations of the [employer]. [facility] will   update reporting as new guidelines are published from the NKF-ASN   Task force.    BUN/Creatinine Ratio 14 10 - 24   Sodium 143 134 - 144 mmol/L   Potassium 4.5 3.5 - 5.2 mmol/L   Chloride 111 (H) 96 - 106 mmol/L   CO2 20 20 - 29 mmol/L   Calcium 8.7 8.6 - 10.2 mg/dL  Magnesium     Status: None   Collection Time: 01/06/20  3:46 PM  Result Value Ref Range   Magnesium 2.1 1.6 - 2.3 mg/dL  Basic metabolic panel     Status: Abnormal   Collection Time: 02/05/20  9:47 AM  Result Value Ref Range   Glucose 119 (H) 65 - 99 mg/dL   BUN 24 8 - 27 mg/dL   Creatinine, Ser 1.68 (H) 0.76 - 1.27 mg/dL   GFR calc non Af Amer 39 (L) >59  mL/min/1.73   GFR calc Af Amer 45 (L) >59 mL/min/1.73    Comment: **[facility] currently reports eGFR in compliance with the current**   recommendations of the [employer]. [facility] will   update reporting as new guidelines are published from the NKF-ASN   Task force.    BUN/Creatinine Ratio 14 10 - 24   Sodium 140 134 - 144 mmol/L   Potassium 4.4 3.5 - 5.2 mmol/L   Chloride 100 96 - 106 mmol/L   CO2 28 20 - 29 mmol/L   Calcium 9.3 8.6 - 10.2 mg/dL  Magnesium     Status: None   Collection Time: 02/05/20  9:47 AM  Result Value Ref Range   Magnesium 2.0 1.6 - 2.3 mg/dL  CBC     Status: Abnormal   Collection Time: 02/05/20  9:47 AM  Result Value Ref Range   WBC 7.9 3.4 - 10.8 x10E3/uL   RBC 4.07 (L) 4.14 - 5.80 x10E6/uL   Hemoglobin 11.7 (L) 13.0 - 17.7 g/dL   Hematocrit 34.6 (L) 37.5 - 51.0 %   MCV 85 79 - 97 fL   MCH 28.7 26.6 - 33.0 pg   MCHC 33.8 31 - 35 g/dL   RDW 14.4 11.6 - 15.4 %   Platelets 216 150 - 450 x10E3/uL    Objective: General: Patient is awake, alert, and oriented x 3 and in no acute distress.  Integument: Skin is warm, dry and supple bilateral. Nails are tender, long, thickened and dystrophic with subungual debris, consistent with onychomycosis, 1-5 bilateral.  Minimal abrasion to toes with no signs of infection bilateral.  Vasculature:  Dorsalis Pedis pulse 1/4 bilateral. Posterior Tibial pulse  0/4 bilateral. Capillary fill time <5 sec 1-5 bilateral. No hair growth to the level of the digits.Temperature gradient within normal limits.  Trophic skin changes present bilateral. 1+ pitting edema present bilateral.   Neurology: The patient has absent sensation measured with a 5.07/10g Semmes Weinstein Monofilament at all pedal sites bilateral . Vibratory sensation absent bilateral with tuning fork. No Babinski sign present bilateral.   Musculoskeletal: Asymptomatic pes planus pedal and minimal hammertoe deformities noted bilateral. Muscular strength 5/5  in all lower extremity muscular groups bilateral without pain on range of motion. No tenderness with calf compression bilateral.  Assessment and Plan: Problem List Items Addressed This Visit      Endocrine   Type 2 diabetes mellitus without complication, with long-term current use  of insulin ([place])    Other Visit Diagnoses    Pain due to onychomycosis of toenails of both feet    -  Primary   PVD (peripheral vascular disease) ([place])       Abrasion of left great toe, initial encounter       Abrasion of second toe of right foot, initial encounter          -Examined patient. -Re-Discussed and educated patient on diabetic foot care, especially with  regards to the vascular, neurological and musculoskeletal systems.  -Mechanically debrided all nails 1-5 bilateral using sterile nail nipper and filed with dremel without incident  -Advised patient to closely monitor abrasions and apply Neosporin as directed -Answered all patient questions -Patient to return  in 3 months for at risk foot care and as scheduled for diabetic shoes -Patient advised to call the office if any problems or questions arise in the meantime.  Landis Martins, DPM

## 2020-03-24 ENCOUNTER — Encounter (HOSPITAL_BASED_OUTPATIENT_CLINIC_OR_DEPARTMENT_OTHER): Payer: Medicare Other | Admitting: Cardiology

## 2020-04-14 ENCOUNTER — Other Ambulatory Visit: Payer: Self-pay

## 2020-04-19 ENCOUNTER — Other Ambulatory Visit: Payer: Self-pay

## 2020-04-19 ENCOUNTER — Encounter: Payer: Self-pay | Admitting: Cardiology

## 2020-04-19 ENCOUNTER — Ambulatory Visit (INDEPENDENT_AMBULATORY_CARE_PROVIDER_SITE_OTHER): Payer: Medicare Other | Admitting: Cardiology

## 2020-04-19 VITALS — BP 120/88 | HR 65 | Ht 70.0 in | Wt 205.6 lb

## 2020-04-19 DIAGNOSIS — Z79899 Other long term (current) drug therapy: Secondary | ICD-10-CM | POA: Diagnosis not present

## 2020-04-19 DIAGNOSIS — I493 Ventricular premature depolarization: Secondary | ICD-10-CM

## 2020-04-19 DIAGNOSIS — I739 Peripheral vascular disease, unspecified: Secondary | ICD-10-CM

## 2020-04-19 DIAGNOSIS — I472 Ventricular tachycardia: Secondary | ICD-10-CM

## 2020-04-19 DIAGNOSIS — I4729 Other ventricular tachycardia: Secondary | ICD-10-CM

## 2020-04-19 DIAGNOSIS — I5032 Chronic diastolic (congestive) heart failure: Secondary | ICD-10-CM

## 2020-04-19 DIAGNOSIS — I2510 Atherosclerotic heart disease of native coronary artery without angina pectoris: Secondary | ICD-10-CM | POA: Diagnosis not present

## 2020-04-19 DIAGNOSIS — E669 Obesity, unspecified: Secondary | ICD-10-CM

## 2020-04-19 DIAGNOSIS — E782 Mixed hyperlipidemia: Secondary | ICD-10-CM

## 2020-04-19 DIAGNOSIS — I10 Essential (primary) hypertension: Secondary | ICD-10-CM

## 2020-04-19 NOTE — Patient Instructions (Signed)
Medication Instructions:  No medication changes. *If you need a refill on your cardiac medications before your next appointment, please call your pharmacy*   Lab Work: Your physician recommends that you have labs done in the office today. Your test included  basic metabolic panel, complete blood count and magnesium.  If you have labs (blood work) drawn today and your tests are completely normal, you will receive your results only by:  [place] (if you have MyChart) OR  A paper copy in the mail If you have any lab test that is abnormal or we need to change your treatment, we will call you to review the results.   Testing/Procedures: None ordered   Follow-Up: At [facility], you and your health needs are our priority.  As part of our continuing mission to provide you with exceptional heart care, we have created designated Provider Care Teams.  These Care Teams include your primary Cardiologist (physician) and Advanced Practice Providers (APPs -  Physician Assistants and Nurse Practitioners) who all work together to provide you with the care you need, when you need it.  We recommend signing up for the patient portal called "MyChart".  Sign up information is provided on this After Visit Summary.  MyChart is used to connect with patients for Virtual Visits (Telemedicine).  Patients are able to view lab/test results, encounter notes, upcoming appointments, etc.  Non-urgent messages can be sent to your provider as well.   To learn more about what you can do with MyChart, go to [email or web address].    Your next appointment:   6 month(s)  The format for your next appointment:   In Person  Provider:   Berniece Salines, DO   Other Instructions NA

## 2020-04-19 NOTE — Progress Notes (Signed)
Cardiology Office Note:    Date:  04/19/2020   ID:  Oscar Kim, DOB May 23, 1940, MRN [id number]  PCP:  [name], NP  Cardiologist:  Berniece Salines, DO  Electrophysiologist:  None   Referring MD: [name], NP     "I am doing well"  History of Present Illness:    Oscar Kim is a 79 y.o. male with a hx of coronaryof artery disease status post stents 15 years ago, PAD status post stenting,chronic diastolic heart failure most recent ejection fraction 60 to 65%, frequent PVCwith multiple episodes of NSVT on monitor, hypertension, hyperlipidemia, diabetes mellitus and obesity.  I last saw the patient on February 05, 2011 at that time he appeared to be doing well from a cardiovascular standpoint.  During the time to stop the Zaroxolyn kept the patient on his Lasix 40 mg twice a day. He tells me he has not had any chest pain or shortness of breath.  He has been doing well from a cardiovascular standpoint.  He is just concerned that he is having low back pain and he is worried that he may be having a kidney infection.  He plans to see his PCP.  He also is so very fatigued.   Past Medical History:  Diagnosis Date  . CAD (coronary artery disease)    Reported infarct and subsequent stent intervention approximately 15 years ago - [place]  . Chronic diastolic heart failure ([place]) 08/22/2019  . Chronic systolic CHF (congestive heart failure) ([place]) 07/23/2019  . Chronic systolic heart failure ([place])   . Coronary artery disease involving native coronary artery of native heart without angina pectoris 07/23/2019  . Dyspnea 07/23/2019  . Essential hypertension   . Fatigue 07/23/2019  . 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 Hyperlipidemia   . Mixed hyperlipidemia 07/23/2019  . NSVT (nonsustained ventricular tachycardia) ([place]) 08/22/2019  . Obesity (BMI 30-39.9)   . Other general symptoms and signs  09/19/2019  . PAD (peripheral artery disease) ([facility])    Previous stent revascularization, left leg -  [place]  . Type 2 diabetes mellitus ([place])   . Type 2 diabetes mellitus without complication, with long-term current use of insulin ([place]) 07/31/2019  . Vitamin D deficiency, unspecified  09/19/2019    Past Surgical History:  Procedure Laterality Date  . CORONARY ANGIOPLASTY WITH STENT PLACEMENT     When he lived in [place], multiple stents placed  . KNEE SURGERY Right     Current Medications: Current Meds  Medication Sig  . allopurinol (ZYLOPRIM) 100 MG tablet Take 100 mg by mouth 2 (two) times daily.  Marland Kitchen amiodarone (PACERONE) 200 MG tablet Take 1 tablet (200 mg total) by mouth daily.  Marland Kitchen aspirin EC 81 MG tablet Take 81 mg by mouth daily.  Marland Kitchen atorvastatin (LIPITOR) 40 MG tablet Take 40 mg by mouth daily.  . carvedilol (COREG) 3.125 MG tablet Take 3.125 mg by mouth 2 (two) times daily.  . clopidogrel (PLAVIX) 75 MG tablet Take 75 mg by mouth daily.  Marland Kitchen escitalopram (LEXAPRO) 5 MG tablet Take 5 mg by mouth daily.  . furosemide (LASIX) 40 MG tablet Take 40 mg by mouth daily. And 40 mg in the evening  . gabapentin (NEURONTIN) 300 MG capsule Take 300 mg by mouth 2 (two) times daily.   Marland Kitchen glipiZIDE (GLUCOTROL XL) 5 MG 24 hr tablet Take 5 mg by mouth daily.  Marland Kitchen lisinopril (ZESTRIL) 2.5 MG tablet Take 2.5 mg by mouth daily.  . methocarbamol (ROBAXIN) 500 MG tablet Take  500 mg by mouth 2 (two) times daily.  . metolazone (ZAROXOLYN) 5 MG tablet Take 5 mg by mouth.  . metoprolol succinate (TOPROL-XL) 25 MG 24 hr tablet Take 12.5 mg by mouth daily.  Marland Kitchen omeprazole (PRILOSEC) 40 MG capsule Take 40 mg by mouth daily.  . pantoprazole (PROTONIX) 40 MG tablet Take 1 tablet (40 mg total) by mouth daily.  . Potassium Chloride ER 20 MEQ TBCR Take 20 mEq by mouth 2 (two) times daily.  Marland Kitchen tiZANidine (ZANAFLEX) 2 MG tablet Take 2 mg by mouth 2 (two) times daily.  . traZODone (DESYREL) 50 MG tablet Take 50 mg by mouth.  Tyler Aas FLEXTOUCH 200 UNIT/ML FlexTouch Pen Inject 45 Units into the skin daily.       Allergies:   Patient has no known allergies.   Social History   Socioeconomic History  . Marital status: Married    Spouse name: Not on file  . Number of children: Not on file  . Years of education: Not on file  . Highest education level: Not on file  Occupational History  . Not on file  Tobacco Use  . Smoking status: Former Smoker    Types: Cigarettes    Quit date: 07/23/1979    Years since quitting: 40.7  . Smokeless tobacco: Current User    Types: Snuff  Vaping Use  . Vaping Use: Never used  Substance and Sexual Activity  . Alcohol use: Never  . Drug use: Never  . Sexual activity: Not on file  Other Topics Concern  . Not on file  Social History Narrative  . Not on file   Social Determinants of Health   Financial Resource Strain: Not on file  Food Insecurity: Not on file  Transportation Needs: Not on file  Physical Activity: Not on file  Stress: Not on file  Social Connections: Not on file     Family History: The patient's family history includes Hypertension in his father.  ROS:   Review of Systems  Constitution: Negative for decreased appetite, fever and weight gain.  HENT: Negative for congestion, ear discharge, hoarse voice and sore throat.   Eyes: Negative for discharge, redness, vision loss in right eye and visual halos.  Cardiovascular: Negative for chest pain, dyspnea on exertion, leg swelling, orthopnea and palpitations.  Respiratory: Negative for cough, hemoptysis, shortness of breath and snoring.   Endocrine: Negative for heat intolerance and polyphagia.  Hematologic/Lymphatic: Negative for bleeding problem. Does not bruise/bleed easily.  Skin: Negative for flushing, nail changes, rash and suspicious lesions.  Musculoskeletal: Reports low back pain.  Negative for arthritis, joint pain, muscle cramps, myalgias, neck pain and stiffness.  Gastrointestinal: Negative for abdominal pain, bowel incontinence, diarrhea and excessive appetite.   Genitourinary: Negative for decreased libido, genital sores and incomplete emptying.  Neurological: Negative for brief paralysis, focal weakness, headaches and loss of balance.  Psychiatric/Behavioral: Negative for altered mental status, depression and suicidal ideas.  Allergic/Immunologic: Negative for HIV exposure and persistent infections.    EKGs/Labs/Other Studies Reviewed:    The following studies were reviewed today:   EKG: None today  Recent Labs: 07/23/2019: NT-Pro BNP 1,962 07/29/2019: B Natriuretic Peptide 65.0 08/22/2019: ALT 14 09/19/2019: TSH 3.180 02/05/2020: BUN 24; Creatinine, Ser 1.68; Hemoglobin 11.7; Magnesium 2.0; Platelets 216; Potassium 4.4; Sodium 140  Recent Lipid Panel    Component Value Date/Time   CHOL 237 (H) 07/31/2019 0256   TRIG 192 (H) 07/31/2019 0256   HDL 31 (L) 07/31/2019 0256   CHOLHDL  7.6 07/31/2019 0256   VLDL 38 07/31/2019 0256   LDLCALC 168 (H) 07/31/2019 0256    Physical Exam:    VS:  BP 120/88   Pulse 65   Ht 5\' 10"  (1.778 m)   Wt 205 lb 9.6 oz (93.3 kg)   SpO2 94%   BMI 29.50 kg/m     Wt Readings from Last 3 Encounters:  04/19/20 205 lb 9.6 oz (93.3 kg)  02/05/20 208 lb 3.2 oz (94.4 kg)  01/06/20 279 lb 6.4 oz (126.7 kg)     GEN: Well nourished, well developed in no acute distress HEENT: Normal NECK: No JVD; No carotid bruits LYMPHATICS: No lymphadenopathy CARDIAC: S1S2 noted,RRR, no murmurs, rubs, gallops RESPIRATORY:  Clear to auscultation without rales, wheezing or rhonchi  ABDOMEN: Soft, non-tender, non-distended, +bowel sounds, no guarding. EXTREMITIES: No edema, No cyanosis, no clubbing MUSCULOSKELETAL:  No deformity  SKIN: Warm and dry NEUROLOGIC:  Alert and oriented x 3, non-focal PSYCHIATRIC:  Normal affect, good insight  ASSESSMENT:    1. Frequent PVCs   2. Medication management   3. Coronary artery disease involving native coronary artery of native heart without angina pectoris   4. PAD (peripheral  artery disease) ([place])   5. Essential hypertension   6. Mixed hyperlipidemia   7. NSVT (nonsustained ventricular tachycardia) ([facility])   8. Chronic diastolic heart failure ([facility])   9. Obesity (BMI 30-39.9)    PLAN:     No leg edema this has improved significantly.  We will continue him on his current Lasix dosing. He has no anginal symptoms we will keep him on his aspirin Plavix and statin at this time. Diabetes is being managed by his PCP. Normal complaints of any orthostatic hypotension continue his current medication regimen. However we do not assess his kidney function as well as electrolytes. PVCs continue patient's amiodarone 200 mg daily.  The patient is in agreement with the above plan. The patient left the office in stable condition.  The patient will follow up in 6 months   Medication Adjustments/Labs and Tests Ordered: Current medicines are reviewed at length with the patient today.  Concerns regarding medicines are outlined above.  No orders of the defined types were placed in this encounter.  No orders of the defined types were placed in this encounter.   There are no Patient Instructions on file for this visit.   Adopting a Healthy Lifestyle.  Know what a healthy weight is for you (roughly BMI <25) and aim to maintain this   Aim for 7+ servings of fruits and vegetables daily   65-80+ fluid ounces of water or unsweet tea for healthy kidneys   Limit to max 1 drink of alcohol per day; avoid smoking/tobacco   Limit animal fats in diet for cholesterol and heart health - choose grass fed whenever available   Avoid highly processed foods, and foods high in saturated/trans fats   Aim for low stress - take time to unwind and care for your mental health   Aim for 150 min of moderate intensity exercise weekly for heart health, and weights twice weekly for bone health   Aim for 7-9 hours of sleep daily   When it comes to diets, agreement about the perfect plan isnt easy  to find, even among the experts. Experts at the [employer] developed an idea known as the Healthy Eating Plate. Just imagine a plate divided into logical, healthy portions.   The emphasis is on diet quality:  Load up on vegetables and fruits - one-half of your plate: Aim for color and variety, and remember that potatoes dont count.   Go for whole grains - one-quarter of your plate: Whole wheat, barley, wheat berries, quinoa, oats, brown rice, and foods made with them. If you want pasta, go with whole wheat pasta.   Protein power - one-quarter of your plate: Fish, chicken, beans, and nuts are all healthy, versatile protein sources. Limit red meat.   The diet, however, does go beyond the plate, offering a few other suggestions.   Use healthy plant oils, such as olive, canola, soy, corn, sunflower and peanut. Check the labels, and avoid partially hydrogenated oil, which have unhealthy trans fats.   If youre thirsty, drink water. Coffee and tea are good in moderation, but skip sugary drinks and limit milk and dairy products to one or two daily servings.   The type of carbohydrate in the diet is more important than the amount. Some sources of carbohydrates, such as vegetables, fruits, whole grains, and beans-are healthier than others.   Finally, stay active  Signed, Berniece Salines, DO  04/19/2020 11:32 AM    [place]

## 2020-04-20 ENCOUNTER — Telehealth: Payer: Self-pay

## 2020-04-20 LAB — CBC WITH DIFFERENTIAL/PLATELET
Basophils Absolute: 0.1 10*3/uL (ref 0.0–0.2)
Basos: 1 %
EOS (ABSOLUTE): 0.3 10*3/uL (ref 0.0–0.4)
Eos: 5 %
Hematocrit: 32.6 % — ABNORMAL LOW (ref 37.5–51.0)
Hemoglobin: 10.7 g/dL — ABNORMAL LOW (ref 13.0–17.7)
Immature Grans (Abs): 0 10*3/uL (ref 0.0–0.1)
Immature Granulocytes: 0 %
Lymphocytes Absolute: 1.8 10*3/uL (ref 0.7–3.1)
Lymphs: 25 %
MCH: 27.7 pg (ref 26.6–33.0)
MCHC: 32.8 g/dL (ref 31.5–35.7)
MCV: 85 fL (ref 79–97)
Monocytes Absolute: 0.7 10*3/uL (ref 0.1–0.9)
Monocytes: 9 %
Neutrophils Absolute: 4.5 10*3/uL (ref 1.4–7.0)
Neutrophils: 60 %
Platelets: 290 10*3/uL (ref 150–450)
RBC: 3.86 x10E6/uL — ABNORMAL LOW (ref 4.14–5.80)
RDW: 15 % (ref 11.6–15.4)
WBC: 7.5 10*3/uL (ref 3.4–10.8)

## 2020-04-20 LAB — MAGNESIUM: Magnesium: 2 mg/dL (ref 1.6–2.3)

## 2020-04-20 LAB — BASIC METABOLIC PANEL
BUN/Creatinine Ratio: 16 (ref 10–24)
BUN: 29 mg/dL — ABNORMAL HIGH (ref 8–27)
CO2: 27 mmol/L (ref 20–29)
Calcium: 8.7 mg/dL (ref 8.6–10.2)
Chloride: 98 mmol/L (ref 96–106)
Creatinine, Ser: 1.82 mg/dL — ABNORMAL HIGH (ref 0.76–1.27)
GFR calc Af Amer: 41 mL/min/{1.73_m2} — ABNORMAL LOW (ref >59)
GFR calc non Af Amer: 35 mL/min/{1.73_m2} — ABNORMAL LOW (ref >59)
Glucose: 91 mg/dL (ref 65–99)
Potassium: 4.3 mmol/L (ref 3.5–5.2)
Sodium: 136 mmol/L (ref 134–144)

## 2020-04-20 NOTE — Telephone Encounter (Signed)
Spoke with patient regarding results and recommendation.  Patient verbalizes understanding and is agreeable to plan of care. Advised patient to call back with any issues or concerns.  

## 2020-04-20 NOTE — Telephone Encounter (Signed)
-----   Message from Berniece Salines, DO sent at 04/20/2020  9:29 AM EST ----- Continue current medication regimen.  Labs stable.

## 2020-04-22 ENCOUNTER — Ambulatory Visit (INDEPENDENT_AMBULATORY_CARE_PROVIDER_SITE_OTHER): Payer: Medicare Other | Admitting: Orthotics

## 2020-04-22 ENCOUNTER — Other Ambulatory Visit: Payer: Self-pay

## 2020-04-22 DIAGNOSIS — M2041 Other hammer toe(s) (acquired), right foot: Secondary | ICD-10-CM | POA: Diagnosis not present

## 2020-04-22 DIAGNOSIS — Z794 Long term (current) use of insulin: Secondary | ICD-10-CM

## 2020-04-22 DIAGNOSIS — M2042 Other hammer toe(s) (acquired), left foot: Secondary | ICD-10-CM

## 2020-04-22 DIAGNOSIS — M2142 Flat foot [pes planus] (acquired), left foot: Secondary | ICD-10-CM | POA: Diagnosis not present

## 2020-04-22 DIAGNOSIS — E119 Type 2 diabetes mellitus without complications: Secondary | ICD-10-CM

## 2020-04-22 DIAGNOSIS — M2141 Flat foot [pes planus] (acquired), right foot: Secondary | ICD-10-CM

## 2020-05-17 ENCOUNTER — Ambulatory Visit: Payer: Medicare Other | Admitting: Cardiology

## 2020-05-17 ENCOUNTER — Encounter: Payer: Self-pay | Admitting: Cardiology

## 2020-05-17 ENCOUNTER — Other Ambulatory Visit: Payer: Self-pay

## 2020-05-17 VITALS — BP 112/60 | HR 77 | Ht 70.0 in | Wt 204.4 lb

## 2020-05-17 DIAGNOSIS — I493 Ventricular premature depolarization: Secondary | ICD-10-CM

## 2020-05-17 DIAGNOSIS — I10 Essential (primary) hypertension: Secondary | ICD-10-CM | POA: Diagnosis not present

## 2020-05-17 DIAGNOSIS — I5022 Chronic systolic (congestive) heart failure: Secondary | ICD-10-CM | POA: Diagnosis not present

## 2020-05-17 DIAGNOSIS — I2510 Atherosclerotic heart disease of native coronary artery without angina pectoris: Secondary | ICD-10-CM | POA: Diagnosis not present

## 2020-05-17 NOTE — Patient Instructions (Signed)

## 2020-05-17 NOTE — Progress Notes (Signed)
Cardiology Office Note:    Date:  05/17/2020   ID:  Oscar Kim, DOB 1941/02/21, MRN [id number]  PCP:  Oscar Sewer, NP  Cardiologist:  Oscar Salines, DO  Electrophysiologist:  None   Referring MD: Oscar Sewer, NP   " I am doing well- Dr. Felipe Kim ask me to see you"  History of Present Illness:    Oscar Kim is a 80 y.o. male with a hx of coronaryof artery disease status post stents 15 years ago, PAD status post stenting,chronic diastolic heart failure most recent ejection fraction 60 to 65%, frequent PVCwith multiple episodes of NSVT on monitor now amiodarone 200 mg daily, hypertension, hyperlipidemia, diabetes mellitus and obesity.  The patient was seen on May 06, 2020 at that time he appeared to be doing well from a cardiovascular standpoint.  I continued all his medications and advised the patient to follow-up in 6 months.  He comes today as he tells me he has PCP asked him to be seen given the fact that he has had some PVCs.  The patient offers no complaints at this time, he denies any chest pain, shortness of breath he tells me he made the appointment because he was asked to be seen by his primary care doctor.  He is here today with his son.  Past Medical History:  Diagnosis Date  . CAD (coronary artery disease)    Reported infarct and subsequent stent intervention approximately 15 years ago - [place]  . Chronic diastolic heart failure ([place]) 08/22/2019  . Chronic systolic CHF (congestive heart failure) ([place]) 07/23/2019  . Chronic systolic heart failure ([place])   . Coronary artery disease involving native coronary artery of native heart without angina pectoris 07/23/2019  . Dyspnea 07/23/2019  . Essential hypertension   . Fatigue 07/23/2019  . 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 Hyperlipidemia   . Mixed hyperlipidemia 07/23/2019  . NSVT (nonsustained ventricular tachycardia) ([place]) 08/22/2019  . Obesity (BMI 30-39.9)   . Other general symptoms and signs  09/19/2019  . PAD  (peripheral artery disease) ([facility])    Previous stent revascularization, left leg - [place]  . Type 2 diabetes mellitus ([place])   . Type 2 diabetes mellitus without complication, with long-term current use of insulin ([place]) 07/31/2019  . Vitamin D deficiency, unspecified  09/19/2019    Past Surgical History:  Procedure Laterality Date  . CORONARY ANGIOPLASTY WITH STENT PLACEMENT     When he lived in [place], multiple stents placed  . KNEE SURGERY Right     Current Medications: Current Meds  Medication Sig  . allopurinol (ZYLOPRIM) 100 MG tablet Take 100 mg by mouth 2 (two) times daily.  Marland Kitchen amiodarone (PACERONE) 200 MG tablet Take 1 tablet (200 mg total) by mouth daily.  Marland Kitchen aspirin EC 81 MG tablet Take 81 mg by mouth daily.  Marland Kitchen atorvastatin (LIPITOR) 40 MG tablet Take 40 mg by mouth daily.  . carvedilol (COREG) 3.125 MG tablet Take 3.125 mg by mouth 2 (two) times daily.  . clopidogrel (PLAVIX) 75 MG tablet Take 75 mg by mouth daily.  Marland Kitchen escitalopram (LEXAPRO) 5 MG tablet Take 5 mg by mouth daily.  . furosemide (LASIX) 40 MG tablet Take 40 mg by mouth daily. And 40 mg in the evening  . gabapentin (NEURONTIN) 300 MG capsule Take 300 mg by mouth 2 (two) times daily.   Marland Kitchen glipiZIDE (GLUCOTROL XL) 5 MG 24 hr tablet Take 5 mg by mouth daily.  Marland Kitchen lisinopril (ZESTRIL) 2.5 MG tablet Take 2.5  mg by mouth daily.  . methocarbamol (ROBAXIN) 500 MG tablet Take 500 mg by mouth 2 (two) times daily.  . metolazone (ZAROXOLYN) 5 MG tablet Take 5 mg by mouth.  . metoprolol succinate (TOPROL-XL) 25 MG 24 hr tablet Take 12.5 mg by mouth daily.  Marland Kitchen omeprazole (PRILOSEC) 40 MG capsule Take 40 mg by mouth daily.  . pantoprazole (PROTONIX) 40 MG tablet Take 1 tablet (40 mg total) by mouth daily.  . Potassium Chloride ER 20 MEQ TBCR Take 20 mEq by mouth 2 (two) times daily.  Marland Kitchen tiZANidine (ZANAFLEX) 2 MG tablet Take 2 mg by mouth 2 (two) times daily.  . traZODone (DESYREL) 50 MG tablet Take 50 mg by mouth.  Tyler Aas FLEXTOUCH 200 UNIT/ML FlexTouch Pen Inject 45 Units into the skin daily.      Allergies:   Patient has no known allergies.   Social History   Socioeconomic History  . Marital status: Married    Spouse name: Not on file  . Number of children: Not on file  . Years of education: Not on file  . Highest education level: Not on file  Occupational History  . Not on file  Tobacco Use  . Smoking status: Former Smoker    Types: Cigarettes    Quit date: 07/23/1979    Years since quitting: 40.8  . Smokeless tobacco: Current User    Types: Snuff  Vaping Use  . Vaping Use: Never used  Substance and Sexual Activity  . Alcohol use: Never  . Drug use: Never  . Sexual activity: Not on file  Other Topics Concern  . Not on file  Social History Narrative  . Not on file   Social Determinants of Health   Financial Resource Strain: Not on file  Food Insecurity: Not on file  Transportation Needs: Not on file  Physical Activity: Not on file  Stress: Not on file  Social Connections: Not on file     Family History: The patient's family history includes Hypertension in his father.  ROS:   Review of Systems  Constitution: Negative for decreased appetite, fever and weight gain.  HENT: Negative for congestion, ear discharge, hoarse voice and sore throat.   Eyes: Negative for discharge, redness, vision loss in right eye and visual halos.  Cardiovascular: Negative for chest pain, dyspnea on exertion, leg swelling, orthopnea and palpitations.  Respiratory: Negative for cough, hemoptysis, shortness of breath and snoring.   Endocrine: Negative for heat intolerance and polyphagia.  Hematologic/Lymphatic: Negative for bleeding problem. Does not bruise/bleed easily.  Skin: Negative for flushing, nail changes, rash and suspicious lesions.  Musculoskeletal: Negative for arthritis, joint pain, muscle cramps, myalgias, neck pain and stiffness.  Gastrointestinal: Negative for abdominal pain, bowel  incontinence, diarrhea and excessive appetite.  Genitourinary: Negative for decreased libido, genital sores and incomplete emptying.  Neurological: Negative for brief paralysis, focal weakness, headaches and loss of balance.  Psychiatric/Behavioral: Negative for altered mental status, depression and suicidal ideas.  Allergic/Immunologic: Negative for HIV exposure and persistent infections.    EKGs/Labs/Other Studies Reviewed:    The following studies were reviewed today:   EKG:  The ekg ordered today demonstrates sinus rhythm with occasional PVCs.  ZIO monitor The patient wore the monitor for 2 days 1 hours starting 07/23/2019. Indication: Premature ventricular complexes  The minimum heart rate was 53 bpm, maximum heart rate was 200 bpm, and average heart rate was 79 bpm. Predominant underlying rhythm was Sinus Rhythm. Slight P wave morphology changes were  noted.   165 Ventricular Tachycardia runs occurred, the run with the fastest interval lasting 4 beats with a maximum rate of 200 bpm, the longest lasting 13 beats with an average rate of 104 bpm.   43 Supraventricular Tachycardia runs occurred, the run with the fastest interval lasting 4 beats with a maximum rate of 182 bpm, the longest lasting 31.2 seconds with an average rate of 101 bpm.  Premature atrial complexes were occasional (2.6%, 6322). Premature Ventricular complexes were frequent (31.6%, [id number]). VE Couplets were occasional (1.9%, 2283), and VE Triplets were rare (<1.0%, 194). Ventricular Bigeminy and Trigeminy were present.  No pauses, No AV block and no atrial fibrillation present. No patient triggered events and diary events noted.    Conclusion: This study is remarkable for the following:                             1. 165 episodes of Nonsustained ventricular tachycardia.                             2. Frequent Premature ventricular complexes (31.6%, 75803).                             3. 43 Paroxysmal  supraventricular tachycardia which may be atrial tachycardia.                             4.  Occasional premature atrial complex.    Transthoracic echocardiogram IMPRESSIONS    1. Left ventricular ejection fraction, by estimation, is 60 to 65%. The  left ventricle has normal function. The left ventricle has no regional  wall motion abnormalities. There is mild left ventricular hypertrophy.  Left ventricular diastolic parameters  are consistent with Grade II diastolic dysfunction  2. Right ventricular systolic function is normal. The right ventricular  size is normal. There is normal pulmonary artery systolic pressure.  3. Left atrial size was mild to moderately dilated.  4. The mitral valve is normal in structure. Mild mitral valve  regurgitation. No evidence of mitral stenosis.   Recent Labs: 07/23/2019: NT-Pro BNP 1,962 07/29/2019: B Natriuretic Peptide 65.0 08/22/2019: ALT 14 09/19/2019: TSH 3.180 04/19/2020: BUN 29; Creatinine, Ser 1.82; Hemoglobin 10.7; Magnesium 2.0; Platelets 290; Potassium 4.3; Sodium 136  Recent Lipid Panel    Component Value Date/Time   CHOL 237 (H) 07/31/2019 0256   TRIG 192 (H) 07/31/2019 0256   HDL 31 (L) 07/31/2019 0256   CHOLHDL 7.6 07/31/2019 0256   VLDL 38 07/31/2019 0256   LDLCALC 168 (H) 07/31/2019 0256    Physical Exam:    VS:  BP 112/60   Pulse 77   Ht 5\' 10"  (1.778 m)   Wt 204 lb 6.4 oz (92.7 kg)   SpO2 96%   BMI 29.33 kg/m     Wt Readings from Last 3 Encounters:  05/17/20 204 lb 6.4 oz (92.7 kg)  04/19/20 205 lb 9.6 oz (93.3 kg)  02/05/20 208 lb 3.2 oz (94.4 kg)     GEN: Well nourished, well developed in no acute distress HEENT: Normal NECK: No JVD; No carotid bruits LYMPHATICS: No lymphadenopathy CARDIAC: S1S2 noted,RRR, no murmurs, rubs, gallops RESPIRATORY:  Clear to auscultation without rales, wheezing or rhonchi  ABDOMEN: Soft, non-tender, non-distended, +bowel sounds, no guarding. EXTREMITIES: No edema, No  cyanosis, no clubbing MUSCULOSKELETAL:  No deformity  SKIN: Warm and dry NEUROLOGIC:  Alert and oriented x 3, non-focal PSYCHIATRIC:  Normal affect, good insight  ASSESSMENT:    1. Essential hypertension   2. Coronary artery disease involving native coronary artery of native heart without angina pectoris   3. Frequent PVCs   4. Chronic systolic CHF (congestive heart failure) (H[facility])    PLAN:     1.  He appears to be doing well from a cardiovascular standpoint.  Reviewed his EKG his QT interval is slightly prolonged but this is an improvement compared to his EKG which was done on February 05, 2020.  He also does have occasional PVCs which causes improvement since he has been on his amiodarone.  No further need at this time for any medication change we will continue to monitor the patient closely.  2. continue current medical regimen no anginal symptoms.  3. blood pressure acceptable  The patient is in agreement with the above plan. The patient left the office in stable condition.  The patient will follow up in   Medication Adjustments/Labs and Tests Ordered: Current medicines are reviewed at length with the patient today.  Concerns regarding medicines are outlined above.  Orders Placed This Encounter  Procedures  . EKG 12-Lead   No orders of the defined types were placed in this encounter.   Patient Instructions  Medication Instructions:  Your physician recommends that you continue on your current medications as directed. Please refer to the Current Medication list given to you today.  *If you need a refill on your cardiac medications before your next appointment, please call your pharmacy*   Lab Work: None If you have labs (blood work) drawn today and your tests are completely normal, you will receive your results only by: Marland Kitchen MyChart Message (if you have MyChart) OR . A paper copy in the mail If you have any lab test that is abnormal or we need to change your treatment, we  will call you to review the results.   Testing/Procedures: None   Follow-Up: At [facility], you and your health needs are our priority.  As part of our continuing mission to provide you with exceptional heart care, we have created designated Provider Care Teams.  These Care Teams include your primary Cardiologist (physician) and Advanced Practice Providers (APPs -  Physician Assistants and Nurse Practitioners) who all work together to provide you with the care you need, when you need it.  We recommend signing up for the patient portal called "MyChart".  Sign up information is provided on this After Visit Summary.  MyChart is used to connect with patients for Virtual Visits (Telemedicine).  Patients are able to view lab/test results, encounter notes, upcoming appointments, etc.  Non-urgent messages can be sent to your provider as well.   To learn more about what you can do with MyChart, go to [email or web address].    Your next appointment:   6 month(s)  The format for your next appointment:   In Person  Provider:   Berniece Salines, DO   Other Instructions      Adopting a Healthy Lifestyle.  Know what a healthy weight is for you (roughly BMI <25) and aim to maintain this   Aim for 7+ servings of fruits and vegetables daily   65-80+ fluid ounces of water or unsweet tea for healthy kidneys   Limit to max 1 drink of alcohol per day; avoid smoking/tobacco   Limit animal fats in diet  for cholesterol and heart health - choose grass fed whenever available   Avoid highly processed foods, and foods high in saturated/trans fats   Aim for low stress - take time to unwind and care for your mental health   Aim for 150 min of moderate intensity exercise weekly for heart health, and weights twice weekly for bone health   Aim for 7-9 hours of sleep daily   When it comes to diets, agreement about the perfect plan isnt easy to find, even among the experts. Experts at the [employer] developed an idea known as the Healthy Eating Plate. Just imagine a plate divided into logical, healthy portions.   The emphasis is on diet quality:   Load up on vegetables and fruits - one-half of your plate: Aim for color and variety, and remember that potatoes dont count.   Go for whole grains - one-quarter of your plate: Whole wheat, barley, wheat berries, quinoa, oats, brown rice, and foods made with them. If you want pasta, go with whole wheat pasta.   Protein power - one-quarter of your plate: Fish, chicken, beans, and nuts are all healthy, versatile protein sources. Limit red meat.   The diet, however, does go beyond the plate, offering a few other suggestions.   Use healthy plant oils, such as olive, canola, soy, corn, sunflower and peanut. Check the labels, and avoid partially hydrogenated oil, which have unhealthy trans fats.   If youre thirsty, drink water. Coffee and tea are good in moderation, but skip sugary drinks and limit milk and dairy products to one or two daily servings.   The type of carbohydrate in the diet is more important than the amount. Some sources of carbohydrates, such as vegetables, fruits, whole grains, and beans-are healthier than others.   Finally, stay active  Signed, Oscar Salines, DO  05/17/2020 1:52 PM    [facility]

## 2020-05-19 DIAGNOSIS — E785 Hyperlipidemia, unspecified: Secondary | ICD-10-CM | POA: Diagnosis not present

## 2020-05-19 DIAGNOSIS — J449 Chronic obstructive pulmonary disease, unspecified: Secondary | ICD-10-CM | POA: Diagnosis not present

## 2020-05-19 DIAGNOSIS — K219 Gastro-esophageal reflux disease without esophagitis: Secondary | ICD-10-CM | POA: Diagnosis not present

## 2020-05-19 DIAGNOSIS — I10 Essential (primary) hypertension: Secondary | ICD-10-CM | POA: Diagnosis not present

## 2020-05-19 DIAGNOSIS — G4700 Insomnia, unspecified: Secondary | ICD-10-CM | POA: Diagnosis not present

## 2020-05-19 DIAGNOSIS — I2510 Atherosclerotic heart disease of native coronary artery without angina pectoris: Secondary | ICD-10-CM | POA: Diagnosis not present

## 2020-05-19 DIAGNOSIS — E1140 Type 2 diabetes mellitus with diabetic neuropathy, unspecified: Secondary | ICD-10-CM | POA: Diagnosis not present

## 2020-05-19 DIAGNOSIS — I509 Heart failure, unspecified: Secondary | ICD-10-CM | POA: Diagnosis not present

## 2020-05-19 DIAGNOSIS — M1990 Unspecified osteoarthritis, unspecified site: Secondary | ICD-10-CM | POA: Diagnosis not present

## 2020-05-19 DIAGNOSIS — E1169 Type 2 diabetes mellitus with other specified complication: Secondary | ICD-10-CM | POA: Diagnosis not present

## 2020-05-19 DIAGNOSIS — I493 Ventricular premature depolarization: Secondary | ICD-10-CM | POA: Diagnosis not present

## 2020-05-19 DIAGNOSIS — D509 Iron deficiency anemia, unspecified: Secondary | ICD-10-CM | POA: Diagnosis not present

## 2020-05-20 ENCOUNTER — Ambulatory Visit (INDEPENDENT_AMBULATORY_CARE_PROVIDER_SITE_OTHER): Payer: Medicare Other

## 2020-05-20 ENCOUNTER — Other Ambulatory Visit: Payer: Self-pay

## 2020-05-20 ENCOUNTER — Telehealth: Payer: Self-pay | Admitting: Cardiology

## 2020-05-20 DIAGNOSIS — I2510 Atherosclerotic heart disease of native coronary artery without angina pectoris: Secondary | ICD-10-CM | POA: Diagnosis not present

## 2020-05-20 DIAGNOSIS — R55 Syncope and collapse: Secondary | ICD-10-CM | POA: Diagnosis not present

## 2020-05-20 DIAGNOSIS — I10 Essential (primary) hypertension: Secondary | ICD-10-CM

## 2020-05-20 DIAGNOSIS — I493 Ventricular premature depolarization: Secondary | ICD-10-CM

## 2020-05-20 MED ORDER — AMIODARONE HCL 200 MG PO TABS
200.0000 mg | ORAL_TABLET | Freq: Every day | ORAL | 3 refills | Status: DC
Start: 2020-05-20 — End: 2020-05-21

## 2020-05-20 NOTE — Telephone Encounter (Signed)
New Message:      Larene Beach called and said [name], NP wanted Dr Harriet Masson to know that the pt have stopped taking his Amiodarone.

## 2020-05-20 NOTE — Telephone Encounter (Signed)
Spoke to the patient just now and let him know Dr. Terrial Rhodes recommendations.   Patient now tells me that he would like for Dr. Harriet Masson to know that he passed out last night. He states that he got up to walk to the bathroom and felt very shaky. He then passed out and hit the floor. Patient states that his wife tells him that it did not last long and he got up and was fine afterward. Patient states that he does not recall feeling dizzy before he passed out but was very shaky. Patient did not get evaluated for this.   I will route to Dr. Harriet Masson for further advice.

## 2020-05-20 NOTE — Addendum Note (Signed)
Addended by: Resa Miner I on: 05/20/2020 09:57 AM   Modules accepted: Orders

## 2020-05-20 NOTE — Progress Notes (Signed)
IO

## 2020-05-20 NOTE — Telephone Encounter (Signed)
Please have him come in today for a monitor to be placed on him - no need for a full office visit. blood with bmp, mag, cbc and have him see me in 4 weeks. Let him know if this happens again he needs to check his blood sugars immediately as well as call 911.

## 2020-05-20 NOTE — Telephone Encounter (Signed)
Spoke to the patient just now and let him know Dr. Terrial Rhodes recommendation. He verbalizes understanding and thanks me for the call back. He states that he will stop by today to get this done

## 2020-05-20 NOTE — Addendum Note (Signed)
Addended by: Resa Miner I on: 05/20/2020 10:53 AM   Modules accepted: Orders

## 2020-05-20 NOTE — Telephone Encounter (Signed)
Please let the patient know that I would like him to please restart his Amiodarone 200mg  daily.

## 2020-05-21 ENCOUNTER — Telehealth: Payer: Self-pay

## 2020-05-21 ENCOUNTER — Other Ambulatory Visit: Payer: Self-pay

## 2020-05-21 LAB — CBC
Hematocrit: 36.2 % — ABNORMAL LOW (ref 37.5–51.0)
Hemoglobin: 12.1 g/dL — ABNORMAL LOW (ref 13.0–17.7)
MCH: 28.3 pg (ref 26.6–33.0)
MCHC: 33.4 g/dL (ref 31.5–35.7)
MCV: 85 fL (ref 79–97)
Platelets: 221 10*3/uL (ref 150–450)
RBC: 4.27 x10E6/uL (ref 4.14–5.80)
RDW: 15.5 % — ABNORMAL HIGH (ref 11.6–15.4)
WBC: 8.7 10*3/uL (ref 3.4–10.8)

## 2020-05-21 LAB — BASIC METABOLIC PANEL
BUN/Creatinine Ratio: 21 (ref 10–24)
BUN: 49 mg/dL — ABNORMAL HIGH (ref 8–27)
CO2: 27 mmol/L (ref 20–29)
Calcium: 8.9 mg/dL (ref 8.6–10.2)
Chloride: 91 mmol/L — ABNORMAL LOW (ref 96–106)
Creatinine, Ser: 2.32 mg/dL — ABNORMAL HIGH (ref 0.76–1.27)
GFR calc Af Amer: 30 mL/min/{1.73_m2} — ABNORMAL LOW (ref >59)
GFR calc non Af Amer: 26 mL/min/{1.73_m2} — ABNORMAL LOW (ref >59)
Glucose: 192 mg/dL — ABNORMAL HIGH (ref 65–99)
Potassium: 3.9 mmol/L (ref 3.5–5.2)
Sodium: 135 mmol/L (ref 134–144)

## 2020-05-21 LAB — MAGNESIUM: Magnesium: 1.9 mg/dL (ref 1.6–2.3)

## 2020-05-21 MED ORDER — AMIODARONE HCL 200 MG PO TABS: 200.0000 mg | ORAL_TABLET | Freq: Every day | ORAL | 3 refills | Status: DC

## 2020-05-21 NOTE — Telephone Encounter (Signed)
Spoke with patient regarding results and recommendation.  Patient verbalizes understanding and is agreeable to plan of care. Advised patient to call back with any issues or concerns.  

## 2020-05-21 NOTE — Telephone Encounter (Signed)
-----   Message from Berniece Salines, DO sent at 05/21/2020  1:38 PM EST ----- Labs stable.  Please check to make sure he restarted his amiodarone

## 2020-06-03 DIAGNOSIS — R55 Syncope and collapse: Secondary | ICD-10-CM | POA: Diagnosis not present

## 2020-06-16 ENCOUNTER — Ambulatory Visit: Payer: Medicare Other | Admitting: Sports Medicine

## 2020-06-17 ENCOUNTER — Ambulatory Visit: Payer: Medicare Other | Admitting: Cardiology

## 2020-06-17 ENCOUNTER — Encounter: Payer: Self-pay | Admitting: Cardiology

## 2020-06-17 ENCOUNTER — Other Ambulatory Visit: Payer: Self-pay

## 2020-06-17 VITALS — BP 112/60 | HR 60 | Ht 67.0 in | Wt 207.0 lb

## 2020-06-17 DIAGNOSIS — I493 Ventricular premature depolarization: Secondary | ICD-10-CM

## 2020-06-17 DIAGNOSIS — E782 Mixed hyperlipidemia: Secondary | ICD-10-CM

## 2020-06-17 DIAGNOSIS — I2510 Atherosclerotic heart disease of native coronary artery without angina pectoris: Secondary | ICD-10-CM | POA: Diagnosis not present

## 2020-06-17 DIAGNOSIS — I472 Ventricular tachycardia: Secondary | ICD-10-CM | POA: Diagnosis not present

## 2020-06-17 DIAGNOSIS — Z794 Long term (current) use of insulin: Secondary | ICD-10-CM

## 2020-06-17 DIAGNOSIS — E119 Type 2 diabetes mellitus without complications: Secondary | ICD-10-CM

## 2020-06-17 DIAGNOSIS — I4729 Other ventricular tachycardia: Secondary | ICD-10-CM

## 2020-06-17 DIAGNOSIS — I739 Peripheral vascular disease, unspecified: Secondary | ICD-10-CM

## 2020-06-17 NOTE — Progress Notes (Signed)
Cardiology Office Note:    Date:  06/17/2020   ID:  Oscar Kim, DOB 1940-09-14, MRN [id number]  PCP:  [name], NP  Cardiologist:  Berniece Salines, DO  Electrophysiologist:  None   Referring MD: [name], NP   " I am doing fine"  History of Present Illness:    Oscar Kim is a 80 y.o. male with a hx of coronaryof artery disease status post stents 15 years ago, PAD status post stenting,chronic diastolic heart failure most recent ejection fraction 60 to 65%, frequent PVCwith multiple episodes of NSVT on monitor now amiodarone 200 mg daily, hypertension, hyperlipidemia, diabetes mellitus and obesity.  The patient was seen on May 06, 2020 at that time he appeared to be doing well from a cardiovascular standpoint.  I continued all his medications and advised the patient to follow-up in 6 months.  He comes today as he tells me he has PCP asked him to be seen given the fact that he has had some PVCs.  I saw the patient on May 17, 2020 at that time he came in because his primary care requested that he had more PVCs on his EKG.  He had had some palpitations that have this on monitor the patient.  He is here today to discuss this.  Past Medical History:  Diagnosis Date  . CAD (coronary artery disease)    Reported infarct and subsequent stent intervention approximately 15 years ago - [place]  . Chronic diastolic heart failure ([place]) 08/22/2019  . Chronic systolic CHF (congestive heart failure) ([place]) 07/23/2019  . Chronic systolic heart failure ([place])   . Coronary artery disease involving native coronary artery of native heart without angina pectoris 07/23/2019  . Dyspnea 07/23/2019  . Essential hypertension   . Fatigue 07/23/2019  . 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 Hyperlipidemia   . Mixed hyperlipidemia 07/23/2019  . NSVT (nonsustained ventricular tachycardia) ([place]) 08/22/2019  . Obesity (BMI 30-39.9)   . Other general symptoms and signs  09/19/2019  . PAD (peripheral artery  disease) ([facility])    Previous stent revascularization, left leg - [place]  . Type 2 diabetes mellitus ([place])   . Type 2 diabetes mellitus without complication, with long-term current use of insulin ([place]) 07/31/2019  . Vitamin D deficiency, unspecified  09/19/2019    Past Surgical History:  Procedure Laterality Date  . CORONARY ANGIOPLASTY WITH STENT PLACEMENT     When he lived in [place], multiple stents placed  . KNEE SURGERY Right     Current Medications: Current Meds  Medication Sig  . allopurinol (ZYLOPRIM) 100 MG tablet Take 100 mg by mouth 2 (two) times daily.  Marland Kitchen amiodarone (PACERONE) 200 MG tablet Take 1 tablet (200 mg total) by mouth daily.  Marland Kitchen aspirin EC 81 MG tablet Take 81 mg by mouth daily.  Marland Kitchen atorvastatin (LIPITOR) 40 MG tablet Take 40 mg by mouth daily.  . carvedilol (COREG) 3.125 MG tablet Take 3.125 mg by mouth 2 (two) times daily.  . clopidogrel (PLAVIX) 75 MG tablet Take 75 mg by mouth daily.  Marland Kitchen escitalopram (LEXAPRO) 5 MG tablet Take 5 mg by mouth daily.  . furosemide (LASIX) 40 MG tablet Take 40 mg by mouth daily. And 40 mg in the evening  . gabapentin (NEURONTIN) 300 MG capsule Take 300 mg by mouth 2 (two) times daily.   Marland Kitchen glipiZIDE (GLUCOTROL XL) 5 MG 24 hr tablet Take 5 mg by mouth daily.  Marland Kitchen lisinopril (ZESTRIL) 2.5 MG tablet Take 2.5 mg by  mouth daily.  . methocarbamol (ROBAXIN) 500 MG tablet Take 500 mg by mouth 2 (two) times daily.  . metolazone (ZAROXOLYN) 5 MG tablet Take 5 mg by mouth.  . metoprolol succinate (TOPROL-XL) 25 MG 24 hr tablet Take 12.5 mg by mouth daily.  Marland Kitchen omeprazole (PRILOSEC) 40 MG capsule Take 40 mg by mouth daily.  . pantoprazole (PROTONIX) 40 MG tablet Take 1 tablet (40 mg total) by mouth daily.  . Potassium Chloride ER 20 MEQ TBCR Take 20 mEq by mouth 2 (two) times daily.  Marland Kitchen tiZANidine (ZANAFLEX) 2 MG tablet Take 2 mg by mouth 2 (two) times daily.  . traZODone (DESYREL) 50 MG tablet Take 50 mg by mouth.  Tyler Aas FLEXTOUCH  200 UNIT/ML FlexTouch Pen Inject 45 Units into the skin daily.      Allergies:   Patient has no known allergies.   Social History   Socioeconomic History  . Marital status: Married    Spouse name: Not on file  . Number of children: Not on file  . Years of education: Not on file  . Highest education level: Not on file  Occupational History  . Not on file  Tobacco Use  . Smoking status: Former Smoker    Types: Cigarettes    Quit date: 07/23/1979    Years since quitting: 40.9  . Smokeless tobacco: Current User    Types: Snuff  Vaping Use  . Vaping Use: Never used  Substance and Sexual Activity  . Alcohol use: Never  . Drug use: Never  . Sexual activity: Not on file  Other Topics Concern  . Not on file  Social History Narrative  . Not on file   Social Determinants of Health   Financial Resource Strain: Not on file  Food Insecurity: Not on file  Transportation Needs: Not on file  Physical Activity: Not on file  Stress: Not on file  Social Connections: Not on file     Family History: The patient's family history includes Hypertension in his father.  ROS:   Review of Systems  Constitution: Negative for decreased appetite, fever and weight gain.  HENT: Negative for congestion, ear discharge, hoarse voice and sore throat.   Eyes: Negative for discharge, redness, vision loss in right eye and visual halos.  Cardiovascular: Negative for chest pain, dyspnea on exertion, leg swelling, orthopnea and palpitations.  Respiratory: Negative for cough, hemoptysis, shortness of breath and snoring.   Endocrine: Negative for heat intolerance and polyphagia.  Hematologic/Lymphatic: Negative for bleeding problem. Does not bruise/bleed easily.  Skin: Negative for flushing, nail changes, rash and suspicious lesions.  Musculoskeletal: Negative for arthritis, joint pain, muscle cramps, myalgias, neck pain and stiffness.  Gastrointestinal: Negative for abdominal pain, bowel incontinence,  diarrhea and excessive appetite.  Genitourinary: Negative for decreased libido, genital sores and incomplete emptying.  Neurological: Negative for brief paralysis, focal weakness, headaches and loss of balance.  Psychiatric/Behavioral: Negative for altered mental status, depression and suicidal ideas.  Allergic/Immunologic: Negative for HIV exposure and persistent infections.    EKGs/Labs/Other Studies Reviewed:    The following studies were reviewed today:   EKG: None today  ZIO monitor The patient wore the monitor for 8 days 14  hours starting  05/20/2020 . Indication: PVC  The minimum heart rate was 41 bpm, maximum heart rate was 171  bpm, and average heart rate was 66 bpm. Predominant underlying rhythm was Sinus Rhythm.  10 Ventricular Tachycardia runs occurred, the run with the fastest interval lasting 7 beats  with a max rate of 171 bpm, the longest lasting 7 beats with an average heart rate of 124 bpm.   11 Supraventricular Tachycardia runs occurred, the run with the fastest interval lasting 5 beats with a maximum heart rate of 150 bpm, the longest lasting 16.3 seconds with an average rate of 104 bpm.  Premature atrial complexes were rare. Premature Ventricular complexes were rare.  No pauses, No AV block and no atrial fibrillation present. 3  patient triggered events and 4 diary events all associated with premature ventricular complexes.   Conclusion: This study is remarkable for nonsustained ventricular tachycardia,  paroxysmal atrial tachycardia and rare symptomatic premature ventricular complex.  Recent Labs: 07/23/2019: NT-Pro BNP 1,962 07/29/2019: B Natriuretic Peptide 65.0 08/22/2019: ALT 14 09/19/2019: TSH 3.180 05/20/2020: BUN 49; Creatinine, Ser 2.32; Hemoglobin 12.1; Magnesium 1.9; Platelets 221; Potassium 3.9; Sodium 135  Recent Lipid Panel    Component Value Date/Time   CHOL 237 (H) 07/31/2019 0256   TRIG 192 (H) 07/31/2019 0256   HDL 31 (L) 07/31/2019  0256   CHOLHDL 7.6 07/31/2019 0256   VLDL 38 07/31/2019 0256   LDLCALC 168 (H) 07/31/2019 0256    Physical Exam:    VS:  BP 112/60   Pulse 60   Ht '5\' 7"'$  (1.702 m)   Wt 207 lb (93.9 kg)   SpO2 97%   BMI 32.42 kg/m     Wt Readings from Last 3 Encounters:  06/17/20 207 lb (93.9 kg)  05/17/20 204 lb 6.4 oz (92.7 kg)  04/19/20 205 lb 9.6 oz (93.3 kg)     GEN: Well nourished, well developed in no acute distress HEENT: Normal NECK: No JVD; No carotid bruits LYMPHATICS: No lymphadenopathy CARDIAC: S1S2 noted,RRR, no murmurs, rubs, gallops RESPIRATORY:  Clear to auscultation without rales, wheezing or rhonchi  ABDOMEN: Soft, non-tender, non-distended, +bowel sounds, no guarding. EXTREMITIES: No edema, No cyanosis, no clubbing MUSCULOSKELETAL:  No deformity  SKIN: Warm and dry NEUROLOGIC:  Alert and oriented x 3, non-focal PSYCHIATRIC:  Normal affect, good insight  ASSESSMENT:    1. NSVT (nonsustained ventricular tachycardia) ([place])   2. Frequent PVCs   3. Coronary artery disease involving native coronary artery of native heart without angina pectoris   4. PAD (peripheral artery disease) ([place])   5. Mixed hyperlipidemia   6. 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)    PLAN:     1.  He still does have some NSVT on his monitor but this has improved significantly from 164 episodes to now 10 episodes.  He is not taking his amiodarone for the last month he tells me because his insurance coverage had labs.  He is waiting for the next month to be able to afford this.  I do not believe this is a unreasonable will work with the patient.  2.  No anginal symptoms we will continue to monitor.  3.  His blood pressure deceptively in the office today.  The patient is in agreement with the above plan. The patient left the office in stable condition.  The patient will follow up in   Medication Adjustments/Labs and Tests Ordered: Current  medicines are reviewed at length with the patient today.  Concerns regarding medicines are outlined above.  No orders of the defined types were placed in this encounter.  No orders of the defined types were placed in this encounter.   Patient Instructions  Medication Instructions:  Your physician recommends that you continue  on your current medications as directed. Please refer to the Current Medication list given to you today.  *If you need a refill on your cardiac medications before your next appointment, please call your pharmacy*   Lab Work: None If you have labs (blood work) drawn today and your tests are completely normal, you will receive your results only by: Marland Kitchen MyChart Message (if you have MyChart) OR . A paper copy in the mail If you have any lab test that is abnormal or we need to change your treatment, we will call you to review the results.   Testing/Procedures: None   Follow-Up: At [facility], you and your health needs are our priority.  As part of our continuing mission to provide you with exceptional heart care, we have created designated Provider Care Teams.  These Care Teams include your primary Cardiologist (physician) and Advanced Practice Providers (APPs -  Physician Assistants and Nurse Practitioners) who all work together to provide you with the care you need, when you need it.  We recommend signing up for the patient portal called "MyChart".  Sign up information is provided on this After Visit Summary.  MyChart is used to connect with patients for Virtual Visits (Telemedicine).  Patients are able to view lab/test results, encounter notes, upcoming appointments, etc.  Non-urgent messages can be sent to your provider as well.   To learn more about what you can do with MyChart, go to [email or web address].    Your next appointment:   3 month(s)  The format for your next appointment:   In Person  Provider:   Berniece Salines, DO   Other Instructions       Adopting a Healthy Lifestyle.  Know what a healthy weight is for you (roughly BMI <25) and aim to maintain this   Aim for 7+ servings of fruits and vegetables daily   65-80+ fluid ounces of water or unsweet tea for healthy kidneys   Limit to max 1 drink of alcohol per day; avoid smoking/tobacco   Limit animal fats in diet for cholesterol and heart health - choose grass fed whenever available   Avoid highly processed foods, and foods high in saturated/trans fats   Aim for low stress - take time to unwind and care for your mental health   Aim for 150 min of moderate intensity exercise weekly for heart health, and weights twice weekly for bone health   Aim for 7-9 hours of sleep daily   When it comes to diets, agreement about the perfect plan isnt easy to find, even among the experts. Experts at the [employer] developed an idea known as the Healthy Eating Plate. Just imagine a plate divided into logical, healthy portions.   The emphasis is on diet quality:   Load up on vegetables and fruits - one-half of your plate: Aim for color and variety, and remember that potatoes dont count.   Go for whole grains - one-quarter of your plate: Whole wheat, barley, wheat berries, quinoa, oats, brown rice, and foods made with them. If you want pasta, go with whole wheat pasta.   Protein power - one-quarter of your plate: Fish, chicken, beans, and nuts are all healthy, versatile protein sources. Limit red meat.   The diet, however, does go beyond the plate, offering a few other suggestions.   Use healthy plant oils, such as olive, canola, soy, corn, sunflower and peanut. Check the labels, and avoid partially hydrogenated oil, which have unhealthy trans fats.  If youre thirsty, drink water. Coffee and tea are good in moderation, but skip sugary drinks and limit milk and dairy products to one or two daily servings.   The type of carbohydrate in the diet is more important  than the amount. Some sources of carbohydrates, such as vegetables, fruits, whole grains, and beans-are healthier than others.   Finally, stay active  Signed, Berniece Salines, DO  06/17/2020 9:17 AM    [place]

## 2020-06-17 NOTE — Patient Instructions (Signed)

## 2020-06-28 ENCOUNTER — Other Ambulatory Visit: Payer: Self-pay | Admitting: Cardiology

## 2020-06-28 NOTE — Telephone Encounter (Signed)
Refill sent to pharmacy.   

## 2020-07-16 DIAGNOSIS — S81801A Unspecified open wound, right lower leg, initial encounter: Secondary | ICD-10-CM | POA: Diagnosis not present

## 2020-07-27 DIAGNOSIS — L97919 Non-pressure chronic ulcer of unspecified part of right lower leg with unspecified severity: Secondary | ICD-10-CM | POA: Diagnosis not present

## 2020-07-27 DIAGNOSIS — F17220 Nicotine dependence, chewing tobacco, uncomplicated: Secondary | ICD-10-CM | POA: Diagnosis not present

## 2020-07-27 DIAGNOSIS — I872 Venous insufficiency (chronic) (peripheral): Secondary | ICD-10-CM | POA: Diagnosis not present

## 2020-07-27 DIAGNOSIS — I739 Peripheral vascular disease, unspecified: Secondary | ICD-10-CM | POA: Diagnosis not present

## 2020-07-27 DIAGNOSIS — E119 Type 2 diabetes mellitus without complications: Secondary | ICD-10-CM | POA: Diagnosis not present

## 2020-07-27 DIAGNOSIS — Z720 Tobacco use: Secondary | ICD-10-CM | POA: Diagnosis not present

## 2020-07-27 DIAGNOSIS — S81801A Unspecified open wound, right lower leg, initial encounter: Secondary | ICD-10-CM | POA: Diagnosis not present

## 2020-07-27 DIAGNOSIS — I2510 Atherosclerotic heart disease of native coronary artery without angina pectoris: Secondary | ICD-10-CM | POA: Diagnosis not present

## 2020-07-27 DIAGNOSIS — L97812 Non-pressure chronic ulcer of other part of right lower leg with fat layer exposed: Secondary | ICD-10-CM | POA: Diagnosis not present

## 2020-08-04 DIAGNOSIS — J019 Acute sinusitis, unspecified: Secondary | ICD-10-CM | POA: Diagnosis not present

## 2020-08-04 DIAGNOSIS — R42 Dizziness and giddiness: Secondary | ICD-10-CM | POA: Diagnosis not present

## 2020-08-17 DIAGNOSIS — K219 Gastro-esophageal reflux disease without esophagitis: Secondary | ICD-10-CM | POA: Diagnosis not present

## 2020-08-17 DIAGNOSIS — I509 Heart failure, unspecified: Secondary | ICD-10-CM | POA: Diagnosis not present

## 2020-08-17 DIAGNOSIS — E1169 Type 2 diabetes mellitus with other specified complication: Secondary | ICD-10-CM | POA: Diagnosis not present

## 2020-08-17 DIAGNOSIS — E785 Hyperlipidemia, unspecified: Secondary | ICD-10-CM | POA: Diagnosis not present

## 2020-08-17 DIAGNOSIS — E1140 Type 2 diabetes mellitus with diabetic neuropathy, unspecified: Secondary | ICD-10-CM | POA: Diagnosis not present

## 2020-08-17 DIAGNOSIS — Z9181 History of falling: Secondary | ICD-10-CM | POA: Diagnosis not present

## 2020-08-17 DIAGNOSIS — D509 Iron deficiency anemia, unspecified: Secondary | ICD-10-CM | POA: Diagnosis not present

## 2020-08-17 DIAGNOSIS — Z139 Encounter for screening, unspecified: Secondary | ICD-10-CM | POA: Diagnosis not present

## 2020-08-17 DIAGNOSIS — J449 Chronic obstructive pulmonary disease, unspecified: Secondary | ICD-10-CM | POA: Diagnosis not present

## 2020-09-15 ENCOUNTER — Encounter: Payer: Self-pay | Admitting: Cardiology

## 2020-09-15 ENCOUNTER — Other Ambulatory Visit: Payer: Self-pay

## 2020-09-15 ENCOUNTER — Ambulatory Visit: Payer: Medicare HMO | Admitting: Cardiology

## 2020-09-15 VITALS — BP 116/60 | HR 58 | Ht 66.0 in | Wt 213.2 lb

## 2020-09-15 DIAGNOSIS — E669 Obesity, unspecified: Secondary | ICD-10-CM

## 2020-09-15 DIAGNOSIS — I2510 Atherosclerotic heart disease of native coronary artery without angina pectoris: Secondary | ICD-10-CM | POA: Diagnosis not present

## 2020-09-15 DIAGNOSIS — I472 Ventricular tachycardia: Secondary | ICD-10-CM | POA: Diagnosis not present

## 2020-09-15 DIAGNOSIS — Z794 Long term (current) use of insulin: Secondary | ICD-10-CM | POA: Diagnosis not present

## 2020-09-15 DIAGNOSIS — E119 Type 2 diabetes mellitus without complications: Secondary | ICD-10-CM

## 2020-09-15 DIAGNOSIS — I493 Ventricular premature depolarization: Secondary | ICD-10-CM

## 2020-09-15 DIAGNOSIS — I10 Essential (primary) hypertension: Secondary | ICD-10-CM

## 2020-09-15 DIAGNOSIS — I739 Peripheral vascular disease, unspecified: Secondary | ICD-10-CM

## 2020-09-15 DIAGNOSIS — E782 Mixed hyperlipidemia: Secondary | ICD-10-CM

## 2020-09-15 DIAGNOSIS — I4729 Other ventricular tachycardia: Secondary | ICD-10-CM

## 2020-09-15 NOTE — Progress Notes (Signed)
Cardiology Office Note:    Date:  09/15/2020   ID:  Oscar Kim, DOB 1941/04/15, MRN [id number]  PCP:  [name], NP  Cardiologist:  Berniece Salines, DO  Electrophysiologist:  None   Referring MD: [name], NP   I am doing a lot better  History of Present Illness:    Oscar Kim is a 80 y.o. male with a hx of coronaryof artery disease status post stents 15 years ago, PAD status post stenting,chronic diastolic heart failure most recent ejection fraction 60 to 65%, frequent PVCwith multiple episodes of NSVT on monitornow amiodarone 200 mg daily, hypertension, hyperlipidemia, diabetes mellitus and obesity.  The patient was seen on May 06, 2020 at that time he appeared to be doing well from a cardiovascular standpoint. I continued all his medications and advised the patient to follow-up in 6 months. He comes today as he tells me he has PCP asked him to be seen given the fact that he has had some PVCs.  I saw the patient on May 17, 2020 at that time he came in because his primary care requested that he had more PVCs on his EKG.  He had had some palpitations that have this on monitor the patient.    I saw the patient in June 17, 2020 at that time he had not been taking his amiodarone and did tell me that he was waiting to transition to another pharmacy and insurance.  He is here today for follow-up visit.  He is doing very well.  He tells me that he has been getting his medicines now from [facility].  He has been back on all of his medication regimen.  He denies any chest pain, shortness of breath, lightheadedness, dizziness.   Past Medical History:  Diagnosis Date  . CAD (coronary artery disease)    Reported infarct and subsequent stent intervention approximately 15 years ago - [place]  . Chronic diastolic heart failure ([place]) 08/22/2019  . Chronic systolic CHF (congestive heart failure) ([place]) 07/23/2019  . Chronic systolic heart failure ([place])   . Coronary artery  disease involving native coronary artery of native heart without angina pectoris 07/23/2019  . Dyspnea 07/23/2019  . Essential hypertension   . Fatigue 07/23/2019  . 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 Hyperlipidemia   . Mixed hyperlipidemia 07/23/2019  . NSVT (nonsustained ventricular tachycardia) ([place]) 08/22/2019  . Obesity (BMI 30-39.9)   . Other general symptoms and signs  09/19/2019  . PAD (peripheral artery disease) (H[facility])    Previous stent revascularization, left leg - [place]  . Type 2 diabetes mellitus ([place])   . Type 2 diabetes mellitus without complication, with long-term current use of insulin ([place]) 07/31/2019  . Vitamin D deficiency, unspecified  09/19/2019    Past Surgical History:  Procedure Laterality Date  . CORONARY ANGIOPLASTY WITH STENT PLACEMENT     When he lived in [place], multiple stents placed  . KNEE SURGERY Right     Current Medications: Current Meds  Medication Sig  . allopurinol (ZYLOPRIM) 100 MG tablet Take 100 mg by mouth 2 (two) times daily.  Marland Kitchen amiodarone (PACERONE) 200 MG tablet Take 1 tablet (200 mg total) by mouth daily.  Marland Kitchen aspirin EC 81 MG tablet Take 81 mg by mouth daily.  Marland Kitchen atorvastatin (LIPITOR) 40 MG tablet Take 40 mg by mouth daily.  . carvedilol (COREG) 3.125 MG tablet Take 3.125 mg by mouth 2 (two) times daily.  . clopidogrel (PLAVIX) 75 MG tablet Take 75 mg by mouth  daily.  . escitalopram (LEXAPRO) 5 MG tablet Take 5 mg by mouth daily.  . furosemide (LASIX) 40 MG tablet TAKE 1 AND 1/2 TAB BY MOUTH EVERY MORNING. AND 1 TAB IN THE EVENING  . gabapentin (NEURONTIN) 300 MG capsule Take 300 mg by mouth 2 (two) times daily.   Marland Kitchen glipiZIDE (GLUCOTROL XL) 5 MG 24 hr tablet Take 5 mg by mouth daily.  Marland Kitchen lisinopril (ZESTRIL) 2.5 MG tablet Take 2.5 mg by mouth daily.  . methocarbamol (ROBAXIN) 500 MG tablet Take 500 mg by mouth 2 (two) times daily.  . metolazone (ZAROXOLYN) 5 MG tablet Take 5 mg by mouth.  . metoprolol succinate (TOPROL-XL) 25 MG 24 hr  tablet Take 12.5 mg by mouth daily.  Marland Kitchen omeprazole (PRILOSEC) 40 MG capsule Take 40 mg by mouth daily.  . pantoprazole (PROTONIX) 40 MG tablet Take 1 tablet (40 mg total) by mouth daily.  . Potassium Chloride ER 20 MEQ TBCR Take 20 mEq by mouth 2 (two) times daily.  Marland Kitchen tiZANidine (ZANAFLEX) 2 MG tablet Take 2 mg by mouth 2 (two) times daily.  . traZODone (DESYREL) 50 MG tablet Take 50 mg by mouth.  Tyler Aas FLEXTOUCH 200 UNIT/ML FlexTouch Pen Inject 45 Units into the skin daily.      Allergies:   Patient has no known allergies.   Social History   Socioeconomic History  . Marital status: Married    Spouse name: Not on file  . Number of children: Not on file  . Years of education: Not on file  . Highest education level: Not on file  Occupational History  . Not on file  Tobacco Use  . Smoking status: Former Smoker    Types: Cigarettes    Quit date: 07/23/1979    Years since quitting: 41.1  . Smokeless tobacco: Current User    Types: Snuff  Vaping Use  . Vaping Use: Never used  Substance and Sexual Activity  . Alcohol use: Never  . Drug use: Never  . Sexual activity: Not on file  Other Topics Concern  . Not on file  Social History Narrative  . Not on file   Social Determinants of Health   Financial Resource Strain: Not on file  Food Insecurity: Not on file  Transportation Needs: Not on file  Physical Activity: Not on file  Stress: Not on file  Social Connections: Not on file     Family History: The patient's family history includes Hypertension in his father.  ROS:   Review of Systems  Constitution: Negative for decreased appetite, fever and weight gain.  HENT: Negative for congestion, ear discharge, hoarse voice and sore throat.   Eyes: Negative for discharge, redness, vision loss in right eye and visual halos.  Cardiovascular: Negative for chest pain, dyspnea on exertion, leg swelling, orthopnea and palpitations.  Respiratory: Negative for cough, hemoptysis,  shortness of breath and snoring.   Endocrine: Negative for heat intolerance and polyphagia.  Hematologic/Lymphatic: Negative for bleeding problem. Does not bruise/bleed easily.  Skin: Negative for flushing, nail changes, rash and suspicious lesions.  Musculoskeletal: Negative for arthritis, joint pain, muscle cramps, myalgias, neck pain and stiffness.  Gastrointestinal: Negative for abdominal pain, bowel incontinence, diarrhea and excessive appetite.  Genitourinary: Negative for decreased libido, genital sores and incomplete emptying.  Neurological: Negative for brief paralysis, focal weakness, headaches and loss of balance.  Psychiatric/Behavioral: Negative for altered mental status, depression and suicidal ideas.  Allergic/Immunologic: Negative for HIV exposure and persistent infections.  EKGs/Labs/Other Studies Reviewed:    The following studies were reviewed today:   EKG: None today  ZIO monitor The patient wore the monitor for 8 days 14 hours starting 05/20/2020 . Indication: PVC  The minimum heart rate was 41 bpm, maximum heart rate was 171 bpm, and average heart rate was 66 bpm. Predominant underlying rhythm was Sinus Rhythm.  10 Ventricular Tachycardia runs occurred, the run with the fastest interval lasting 7 beats with a max rate of 171 bpm, the longest lasting 7 beats with an average heart rate of 124 bpm.   11 Supraventricular Tachycardia runs occurred, the run with the fastest interval lasting 5 beats with a maximum heart rate of 150 bpm, the longest lasting 16.3 seconds with an average rate of 104 bpm.  Premature atrial complexes were rare. Premature Ventricular complexes were rare.  No pauses, No AV block and no atrial fibrillation present. 3 patient triggered events and 4 diary events all associated with premature ventricular complexes.   Conclusion: This study is remarkable for nonsustained ventricular tachycardia, paroxysmal atrial tachycardia and  rare symptomatic premature ventricular complex.   Recent Labs: 09/19/2019: TSH 3.180 05/20/2020: BUN 49; Creatinine, Ser 2.32; Hemoglobin 12.1; Magnesium 1.9; Platelets 221; Potassium 3.9; Sodium 135  Recent Lipid Panel    Component Value Date/Time   CHOL 237 (H) 07/31/2019 0256   TRIG 192 (H) 07/31/2019 0256   HDL 31 (L) 07/31/2019 0256   CHOLHDL 7.6 07/31/2019 0256   VLDL 38 07/31/2019 0256   LDLCALC 168 (H) 07/31/2019 0256    Physical Exam:    VS:  BP 116/60   Pulse (!) 58   Ht '5\' 6"'$  (1.676 m)   Wt 213 lb 3.2 oz (96.7 kg)   SpO2 98%   BMI 34.41 kg/m     Wt Readings from Last 3 Encounters:  09/15/20 213 lb 3.2 oz (96.7 kg)  06/17/20 207 lb (93.9 kg)  05/17/20 204 lb 6.4 oz (92.7 kg)     GEN: Well nourished, well developed in no acute distress HEENT: Normal NECK: No JVD; No carotid bruits LYMPHATICS: No lymphadenopathy CARDIAC: S1S2 noted,RRR, no murmurs, rubs, gallops RESPIRATORY:  Clear to auscultation without rales, wheezing or rhonchi  ABDOMEN: Soft, non-tender, non-distended, +bowel sounds, no guarding. EXTREMITIES: No edema, No cyanosis, no clubbing MUSCULOSKELETAL:  No deformity  SKIN: Warm and dry NEUROLOGIC:  Alert and oriented x 3, non-focal PSYCHIATRIC:  Normal affect, good insight  ASSESSMENT:    1. Frequent PVCs   2. Coronary artery disease involving native coronary artery of native heart without angina pectoris   3. PAD (peripheral artery disease) ([place])   4. NSVT (nonsustained ventricular tachycardia) ([place])   5. Essential hypertension   6. Type 2 diabetes mellitus without complication, with long-term current use of insulin ([place])   7. Mixed hyperlipidemia   8. Obesity (BMI 30-39.9)    PLAN:    He appears to be doing well from a cardiovascular standpoint.  He is back on his amiodarone and does not feel any palpitations.  We talked about medication adherence.  The patient is now getting his medication from [facility] which she tells me is more  reliable.  No angina symptoms continue the patient on his aspirin, Plavix as well as atorvastatin 40 milligrams daily.  This is being managed by his primary care doctor.  No adjustments for antidiabetic medications were made today.  I reviewed the [place] he had some blood work with his PCP on April 12 which showed HDL 28,  LDL 64, total cholesterol 121, triglyceride 166.  Continue his lipid-lowering agent.  His hemoglobin A1c at the time was 9.6.  He tells me since that time his PCP has adjusted his insulin regimen as well as antidiabetic medication.  I offered possible referral for endocrinology but the patient has declined.  The patient understands the need to lose weight with diet and exercise. We have discussed specific strategies for this.  The patient is in agreement with the above plan. The patient left the office in stable condition.  The patient will follow up in 6 months or sooner if needed.   Medication Adjustments/Labs and Tests Ordered: Current medicines are reviewed at length with the patient today.  Concerns regarding medicines are outlined above.  No orders of the defined types were placed in this encounter.  No orders of the defined types were placed in this encounter.   Patient Instructions  Medication Instructions:  Your physician recommends that you continue on your current medications as directed. Please refer to the Current Medication list given to you today.  *If you need a refill on your cardiac medications before your next appointment, please call your pharmacy*   Lab Work: None If you have labs (blood work) drawn today and your tests are completely normal, you will receive your results only by: Marland Kitchen MyChart Message (if you have MyChart) OR . A paper copy in the mail If you have any lab test that is abnormal or we need to change your treatment, we will call you to review the results.   Testing/Procedures: None   Follow-Up: At [facility], you and your  health needs are our priority.  As part of our continuing mission to provide you with exceptional heart care, we have created designated Provider Care Teams.  These Care Teams include your primary Cardiologist (physician) and Advanced Practice Providers (APPs -  Physician Assistants and Nurse Practitioners) who all work together to provide you with the care you need, when you need it.  We recommend signing up for the patient portal called "MyChart".  Sign up information is provided on this After Visit Summary.  MyChart is used to connect with patients for Virtual Visits (Telemedicine).  Patients are able to view lab/test results, encounter notes, upcoming appointments, etc.  Non-urgent messages can be sent to your provider as well.   To learn more about what you can do with MyChart, go to [email or web address].    Your next appointment:   6 month(s)  The format for your next appointment:   In Person  Provider:   Berniece Salines, DO   Other Instructions      Adopting a Healthy Lifestyle.  Know what a healthy weight is for you (roughly BMI <25) and aim to maintain this   Aim for 7+ servings of fruits and vegetables daily   65-80+ fluid ounces of water or unsweet tea for healthy kidneys   Limit to max 1 drink of alcohol per day; avoid smoking/tobacco   Limit animal fats in diet for cholesterol and heart health - choose grass fed whenever available   Avoid highly processed foods, and foods high in saturated/trans fats   Aim for low stress - take time to unwind and care for your mental health   Aim for 150 min of moderate intensity exercise weekly for heart health, and weights twice weekly for bone health   Aim for 7-9 hours of sleep daily   When it comes to diets, agreement about the perfect plan isnt  easy to find, even among the experts. Experts at the [place] [employer] developed an idea known as the Healthy Eating Plate. Just imagine a plate divided into logical,  healthy portions.   The emphasis is on diet quality:   Load up on vegetables and fruits - one-half of your plate: Aim for color and variety, and remember that potatoes dont count.   Go for whole grains - one-quarter of your plate: Whole wheat, barley, wheat berries, quinoa, oats, brown rice, and foods made with them. If you want pasta, go with whole wheat pasta.   Protein power - one-quarter of your plate: Fish, chicken, beans, and nuts are all healthy, versatile protein sources. Limit red meat.   The diet, however, does go beyond the plate, offering a few other suggestions.   Use healthy plant oils, such as olive, canola, soy, corn, sunflower and peanut. Check the labels, and avoid partially hydrogenated oil, which have unhealthy trans fats.   If youre thirsty, drink water. Coffee and tea are good in moderation, but skip sugary drinks and limit milk and dairy products to one or two daily servings.   The type of carbohydrate in the diet is more important than the amount. Some sources of carbohydrates, such as vegetables, fruits, whole grains, and beans-are healthier than others.   Finally, stay active  Signed, Berniece Salines, DO  09/15/2020 12:55 PM    [facility]

## 2020-09-15 NOTE — Patient Instructions (Signed)

## 2020-09-29 ENCOUNTER — Other Ambulatory Visit: Payer: Self-pay

## 2020-09-29 ENCOUNTER — Ambulatory Visit: Payer: Medicare HMO | Admitting: Sports Medicine

## 2020-09-29 ENCOUNTER — Encounter: Payer: Self-pay | Admitting: Sports Medicine

## 2020-09-29 DIAGNOSIS — B351 Tinea unguium: Secondary | ICD-10-CM | POA: Diagnosis not present

## 2020-09-29 DIAGNOSIS — E119 Type 2 diabetes mellitus without complications: Secondary | ICD-10-CM | POA: Diagnosis not present

## 2020-09-29 DIAGNOSIS — M79675 Pain in left toe(s): Secondary | ICD-10-CM

## 2020-09-29 DIAGNOSIS — Z794 Long term (current) use of insulin: Secondary | ICD-10-CM

## 2020-09-29 DIAGNOSIS — M79674 Pain in right toe(s): Secondary | ICD-10-CM | POA: Diagnosis not present

## 2020-09-29 DIAGNOSIS — I739 Peripheral vascular disease, unspecified: Secondary | ICD-10-CM | POA: Diagnosis not present

## 2020-09-29 DIAGNOSIS — S80811A Abrasion, right lower leg, initial encounter: Secondary | ICD-10-CM

## 2020-09-29 NOTE — Progress Notes (Signed)
Subjective: Oscar Kim is a 80 y.o. male patient with history of diabetes presents to office for diabetic nail care. Reports that he scrapped the shin on the right a couple of days ago, white has been applying dry dressing. Patient denies any pedal complaints since last encounter. No other issues noted.  Fasting blood sugar 181 yesterday  A1c 7 something PCP [place] x couple of weeks ago   Patient Active Problem List   Diagnosis Date Noted  . CAD (coronary artery disease)   . Chronic systolic heart failure ([place])   . Hyperlipidemia   . Obesity (BMI 30-39.9)   . Type 2 diabetes mellitus ([place])   . Other general symptoms and signs  09/19/2019  . Vitamin D deficiency, unspecified  09/19/2019  . NSVT (nonsustained ventricular tachycardia) ([place]) 08/22/2019  . Chronic diastolic heart failure ([place]) 08/22/2019  . Type 2 diabetes mellitus without complication, with long-term current use of insulin ([place]) 07/31/2019  . 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 Chronic systolic CHF (congestive heart failure) ([place]) 07/23/2019  . Dyspnea 07/23/2019  . Fatigue 07/23/2019  . Coronary artery disease involving native coronary artery of native heart without angina pectoris 07/23/2019  . PAD (peripheral artery disease) ([place]) 07/23/2019  . Mixed hyperlipidemia 07/23/2019  . Essential hypertension 07/23/2019   Current Outpatient Medications on File Prior to Visit  Medication Sig Dispense Refill  . allopurinol (ZYLOPRIM) 100 MG tablet Take 100 mg by mouth 2 (two) times daily.    Marland Kitchen amiodarone (PACERONE) 200 MG tablet Take 1 tablet (200 mg total) by mouth daily. 90 tablet 3  . aspirin EC 81 MG tablet Take 81 mg by mouth daily.    Marland Kitchen atorvastatin (LIPITOR) 40 MG tablet Take 40 mg by mouth daily.    . carvedilol (COREG) 3.125 MG tablet Take 3.125 mg by mouth 2 (two) times daily.    . clopidogrel (PLAVIX) 75 MG tablet Take 75 mg by mouth daily.    Marland Kitchen escitalopram (LEXAPRO) 5 MG tablet Take 5 mg by mouth daily.    .  furosemide (LASIX) 40 MG tablet TAKE 1 AND 1/2 TAB BY MOUTH EVERY MORNING. AND 1 TAB IN THE EVENING 225 tablet 1  . gabapentin (NEURONTIN) 300 MG capsule Take 300 mg by mouth 2 (two) times daily.     Marland Kitchen glipiZIDE (GLUCOTROL XL) 5 MG 24 hr tablet Take 5 mg by mouth daily.    Marland Kitchen lisinopril (ZESTRIL) 2.5 MG tablet Take 2.5 mg by mouth daily.    . methocarbamol (ROBAXIN) 500 MG tablet Take 500 mg by mouth 2 (two) times daily.    . metolazone (ZAROXOLYN) 5 MG tablet Take 5 mg by mouth.    . metoprolol succinate (TOPROL-XL) 25 MG 24 hr tablet Take 12.5 mg by mouth daily.    Marland Kitchen omeprazole (PRILOSEC) 40 MG capsule Take 40 mg by mouth daily.    . pantoprazole (PROTONIX) 40 MG tablet Take 1 tablet (40 mg total) by mouth daily. 30 tablet 1  . Potassium Chloride ER 20 MEQ TBCR Take 20 mEq by mouth 2 (two) times daily. 60 tablet 3  . tiZANidine (ZANAFLEX) 2 MG tablet Take 2 mg by mouth 2 (two) times daily.    . traZODone (DESYREL) 50 MG tablet Take 50 mg by mouth.    Tyler Aas FLEXTOUCH 200 UNIT/ML FlexTouch Pen Inject 45 Units into the skin daily.      No current facility-administered medications on file prior to visit.   No Known Allergies  No results  found for this or any previous visit (from the past 2160 hour(s)).  Objective: General: Patient is awake, alert, and oriented x 3 and in no acute distress.  Integument: Skin is warm, dry and supple bilateral. Nails are tender, long, thickened and dystrophic with subungual debris, consistent with onychomycosis, 1-5 bilateral. + Right anterior shin abrasion that measures less than 3 cm with peeling skin and minimal blachable erythema, no edema, no malodor, no active drainage.  Vasculature:  Dorsalis Pedis pulse 1/4 bilateral. Posterior Tibial pulse  0/4 bilateral. Capillary fill time <5 sec 1-5 bilateral. No hair growth to the level of the digits.Temperature gradient within normal limits.  Trophic skin changes present bilateral. 1+ pitting edema present  bilateral.   Neurology: The patient has absent sensation measured with a 5.07/10g Semmes Weinstein Monofilament at all pedal sites bilateral . Vibratory sensation absent bilateral with tuning fork. No Babinski sign present bilateral.   Musculoskeletal: Asymptomatic pes planus pedal and minimal hammertoe deformities noted bilateral. Muscular strength 5/5 in all lower extremity muscular groups bilateral without pain on range of motion. No tenderness with calf compression bilateral.  Assessment and Plan: Problem List Items Addressed This Visit      Endocrine   Type 2 diabetes mellitus without complication, with long-term current use of insulin ([place])    Other Visit Diagnoses    Pain due to onychomycosis of toenails of both feet    -  Primary   PVD (peripheral vascular disease) ([place])          -Examined patient. -Re-Discussed and educated patient on diabetic foot care, especially with  regards to the vascular, neurological and musculoskeletal systems.  -Mechanically debrided all nails 1-5 bilateral using sterile nail nipper and filed with dremel without incident  -Applied antibiotic cream and dry dressing to right anterior shin and advised patient to do the same; if worsens advised patient to follow up with PCP -Answered all patient questions -Patient to return  in 3 months for at risk foot care and as scheduled for diabetic shoes -Patient advised to call the office if any problems or questions arise in the meantime.  Landis Martins, DPM

## 2020-10-22 ENCOUNTER — Telehealth: Payer: Self-pay

## 2020-10-22 DIAGNOSIS — I509 Heart failure, unspecified: Secondary | ICD-10-CM | POA: Diagnosis not present

## 2020-10-22 DIAGNOSIS — N390 Urinary tract infection, site not specified: Secondary | ICD-10-CM | POA: Diagnosis not present

## 2020-10-22 NOTE — Telephone Encounter (Signed)
Please get auth. Thank you 

## 2020-10-22 NOTE — Telephone Encounter (Signed)
Patient called he wants to get pre approval; process started for a gel injection patient last received injection 03/01/2020 call back:[phone]

## 2020-10-26 NOTE — Telephone Encounter (Signed)
Noted  

## 2020-10-27 ENCOUNTER — Telehealth: Payer: Self-pay

## 2020-10-27 NOTE — Telephone Encounter (Signed)
VOB submitted for Monovisc, right knee. Pending BV. 

## 2020-10-29 ENCOUNTER — Telehealth: Payer: Self-pay

## 2020-10-29 NOTE — Telephone Encounter (Signed)
PA required for Monovisc, right knee. PA Pending # [id number]

## 2020-11-01 ENCOUNTER — Telehealth: Payer: Self-pay

## 2020-11-01 NOTE — Telephone Encounter (Signed)
PA still Pending through [facility], for Monovisc, right knee.

## 2020-11-02 ENCOUNTER — Telehealth: Payer: Self-pay

## 2020-11-02 NOTE — Telephone Encounter (Signed)
FYI- Authorization will not be approved until patient has an updated visit from previous gel injection, per [facility]. Patient has appt. 11/03/2020.   Please provide documentation of: -Dated Imaging Report  -The most recent office visit note(s)  -The percent of relief from previous injections   and over what time period  -The efficacy of previous Viscosupplement injection  -Any activity/lifestyle modifications made -Conservative treatments such as education, strengthening and range of motion exercises, assisted devices, and weight loss -Documentation that the patient has had previous treatment, contraindication, or intolerance to simple pain medications (e.g. acetaminophen, NSAID, narcotics, salicylates) Steroid injections

## 2020-11-02 NOTE — Telephone Encounter (Signed)
FYI appt Wednesday

## 2020-11-03 ENCOUNTER — Encounter: Payer: Self-pay | Admitting: Orthopaedic Surgery

## 2020-11-03 ENCOUNTER — Ambulatory Visit: Payer: Medicare HMO | Admitting: Orthopaedic Surgery

## 2020-11-03 DIAGNOSIS — M1711 Unilateral primary osteoarthritis, right knee: Secondary | ICD-10-CM | POA: Diagnosis not present

## 2020-11-03 MED ORDER — HYALURONAN 88 MG/4ML IX SOSY
88.0000 mg | PREFILLED_SYRINGE | INTRA_ARTICULAR | Status: AC | PRN
  Administered 2020-11-03: 13:00:00 88 mg via INTRA_ARTICULAR

## 2020-11-03 NOTE — Progress Notes (Signed)
   Procedure Note  Patient: Oscar Kim             Date of Birth: 18-May-1940           MRN: [id number]             Visit Date: 11/03/2020  Procedures: Visit Diagnoses:  1. Primary osteoarthritis of right knee     Large Joint Inj: R knee on 11/03/2020 1:07 PM Indications: diagnostic evaluation and pain Details: 22 G 1.5 in needle, superolateral approach  Arthrogram: No  Medications: 88 mg Hyaluronan 88 MG/4ML Outcome: tolerated well, no immediate complications Procedure, treatment alternatives, risks and benefits explained, specific risks discussed. Consent was given by the patient. Immediately prior to procedure a time out was called to verify the correct patient, procedure, equipment, support staff and site/side marked as required. Patient was prepped and draped in the usual sterile fashion.   The patient is here today for scheduled Monovisc injection in his right knee to treat the pain from osteoarthritis.  He has had gel injections like this before and has tolerated them well.  He has tried conservative treatment including steroid injections.  He has known osteoarthritis of the right knee.  There is no effusion with his right knee today.  I placed a Monovisc in the right knee without difficulty.  Follow-up for the right knee will be as needed.  He does have significant venous stasis changes in both legs and there is some blistering.  I have recommended he get an appointment with Dr. Salome Holmes to evaluate the possibility of compressive socks that Dr. Sharol Given recommends a lot of times versus Unna boot wraps.

## 2020-11-10 NOTE — Progress Notes (Signed)
Patient in office today to pick-up diabetic shoes. Patient seen by EJ with [facility] who educated patient on the break-in process. The shoes were tried on and patient was satisfied with the fit. Advised patient to call the office with any questions, comments, or concerns. Patient verbalized understanding.  

## 2020-11-11 ENCOUNTER — Ambulatory Visit: Payer: Medicare HMO | Admitting: Orthopedic Surgery

## 2020-11-19 DIAGNOSIS — D509 Iron deficiency anemia, unspecified: Secondary | ICD-10-CM | POA: Diagnosis not present

## 2020-11-19 DIAGNOSIS — I509 Heart failure, unspecified: Secondary | ICD-10-CM | POA: Diagnosis not present

## 2020-11-19 DIAGNOSIS — E1169 Type 2 diabetes mellitus with other specified complication: Secondary | ICD-10-CM | POA: Diagnosis not present

## 2020-11-19 DIAGNOSIS — J449 Chronic obstructive pulmonary disease, unspecified: Secondary | ICD-10-CM | POA: Diagnosis not present

## 2020-11-19 DIAGNOSIS — E785 Hyperlipidemia, unspecified: Secondary | ICD-10-CM | POA: Diagnosis not present

## 2020-11-19 DIAGNOSIS — E1140 Type 2 diabetes mellitus with diabetic neuropathy, unspecified: Secondary | ICD-10-CM | POA: Diagnosis not present

## 2020-11-19 DIAGNOSIS — M1990 Unspecified osteoarthritis, unspecified site: Secondary | ICD-10-CM | POA: Diagnosis not present

## 2020-11-19 DIAGNOSIS — G4700 Insomnia, unspecified: Secondary | ICD-10-CM | POA: Diagnosis not present

## 2020-11-19 DIAGNOSIS — K219 Gastro-esophageal reflux disease without esophagitis: Secondary | ICD-10-CM | POA: Diagnosis not present

## 2020-12-13 DIAGNOSIS — M549 Dorsalgia, unspecified: Secondary | ICD-10-CM | POA: Diagnosis not present

## 2020-12-13 DIAGNOSIS — E1140 Type 2 diabetes mellitus with diabetic neuropathy, unspecified: Secondary | ICD-10-CM | POA: Diagnosis not present

## 2020-12-13 DIAGNOSIS — M25552 Pain in left hip: Secondary | ICD-10-CM | POA: Diagnosis not present

## 2020-12-13 DIAGNOSIS — G629 Polyneuropathy, unspecified: Secondary | ICD-10-CM | POA: Diagnosis not present

## 2020-12-13 DIAGNOSIS — M25551 Pain in right hip: Secondary | ICD-10-CM | POA: Diagnosis not present

## 2020-12-13 DIAGNOSIS — R262 Difficulty in walking, not elsewhere classified: Secondary | ICD-10-CM | POA: Diagnosis not present

## 2020-12-13 DIAGNOSIS — Z6834 Body mass index (BMI) 34.0-34.9, adult: Secondary | ICD-10-CM | POA: Diagnosis not present

## 2020-12-17 DIAGNOSIS — R2689 Other abnormalities of gait and mobility: Secondary | ICD-10-CM | POA: Diagnosis not present

## 2020-12-17 DIAGNOSIS — M6281 Muscle weakness (generalized): Secondary | ICD-10-CM | POA: Diagnosis not present

## 2020-12-20 DIAGNOSIS — R2689 Other abnormalities of gait and mobility: Secondary | ICD-10-CM | POA: Diagnosis not present

## 2020-12-20 DIAGNOSIS — M6281 Muscle weakness (generalized): Secondary | ICD-10-CM | POA: Diagnosis not present

## 2020-12-21 ENCOUNTER — Other Ambulatory Visit: Payer: Self-pay

## 2020-12-21 MED ORDER — AMIODARONE HCL 200 MG PO TABS: 200.0000 mg | ORAL_TABLET | Freq: Every day | ORAL | 3 refills | Status: DC

## 2020-12-21 MED ORDER — FUROSEMIDE 40 MG PO TABS: ORAL_TABLET | ORAL | 1 refills | Status: DC

## 2020-12-22 DIAGNOSIS — M6281 Muscle weakness (generalized): Secondary | ICD-10-CM | POA: Diagnosis not present

## 2020-12-22 DIAGNOSIS — R2689 Other abnormalities of gait and mobility: Secondary | ICD-10-CM | POA: Diagnosis not present

## 2020-12-24 ENCOUNTER — Telehealth: Payer: Self-pay | Admitting: Physical Medicine and Rehabilitation

## 2020-12-24 NOTE — Telephone Encounter (Signed)
Per Dr. Ernestina Patches: OV no images that I can see. If he has any images or reports for hi s hip and lspine would like to see them or we will retake while hear  I called the patient x2 and left messages x2.

## 2020-12-24 NOTE — Telephone Encounter (Signed)
-----   Message from Magnus Sinning, MD sent at 12/21/2020  1:23 PM EDT ----- Regarding: RE: Referral OV no images that I can see. If he has any images or reports for hi s hip and lspine would like to see them or we will retake while hear  ----- Message ----- From: Sherre Scarlet, RT Sent: 12/20/2020   7:58 AM EDT To: Magnus Sinning, MD Subject: Referral                                       Please advise. ----- Message ----- From: Wilson Lions Sent: 12/17/2020   1:34 PM EDT To: Sherre Scarlet, RT  REFERRAL

## 2020-12-28 DIAGNOSIS — R2689 Other abnormalities of gait and mobility: Secondary | ICD-10-CM | POA: Diagnosis not present

## 2020-12-28 DIAGNOSIS — M6281 Muscle weakness (generalized): Secondary | ICD-10-CM | POA: Diagnosis not present

## 2020-12-31 ENCOUNTER — Encounter: Payer: Self-pay | Admitting: Sports Medicine

## 2020-12-31 ENCOUNTER — Ambulatory Visit: Payer: Medicare HMO | Admitting: Sports Medicine

## 2020-12-31 ENCOUNTER — Other Ambulatory Visit: Payer: Self-pay

## 2020-12-31 DIAGNOSIS — M79674 Pain in right toe(s): Secondary | ICD-10-CM | POA: Diagnosis not present

## 2020-12-31 DIAGNOSIS — B351 Tinea unguium: Secondary | ICD-10-CM

## 2020-12-31 DIAGNOSIS — R2689 Other abnormalities of gait and mobility: Secondary | ICD-10-CM | POA: Diagnosis not present

## 2020-12-31 DIAGNOSIS — E119 Type 2 diabetes mellitus without complications: Secondary | ICD-10-CM

## 2020-12-31 DIAGNOSIS — I739 Peripheral vascular disease, unspecified: Secondary | ICD-10-CM

## 2020-12-31 DIAGNOSIS — M6281 Muscle weakness (generalized): Secondary | ICD-10-CM | POA: Diagnosis not present

## 2020-12-31 DIAGNOSIS — Z794 Long term (current) use of insulin: Secondary | ICD-10-CM

## 2020-12-31 DIAGNOSIS — M79675 Pain in left toe(s): Secondary | ICD-10-CM | POA: Diagnosis not present

## 2020-12-31 NOTE — Progress Notes (Signed)
Subjective: Oscar Kim is a 80 y.o. male patient with history of diabetes presents to office for diabetic nail care. No other issues noted.  Fasting blood sugar 121 A1c 7 something but not sure PCP [place] x couple of 1.5 months ago   Patient Active Problem List   Diagnosis Date Noted   CAD (coronary artery disease)    Chronic systolic heart failure (HCC)    Hyperlipidemia    Obesity (BMI 30-39.9)    Type 2 diabetes mellitus ([place])    Other general symptoms and signs  09/19/2019   Vitamin D deficiency, unspecified  09/19/2019   NSVT (nonsustained ventricular tachycardia) ([place]) 08/22/2019   Chronic diastolic heart failure ([place]) 08/22/2019   Type 2 diabetes mellitus without complication, with long-term current use of insulin ([place]) 07/31/2019   Frequent PVCs [id number]   Chronic systolic CHF (congestive heart failure) ([place]) 07/23/2019   Dyspnea 07/23/2019   Fatigue 07/23/2019   Coronary artery disease involving native coronary artery of native heart without angina pectoris 07/23/2019   PAD (peripheral artery disease) ([place]) 07/23/2019   Mixed hyperlipidemia 07/23/2019   Essential hypertension 07/23/2019   Current Outpatient Medications on File Prior to Visit  Medication Sig Dispense Refill   allopurinol (ZYLOPRIM) 100 MG tablet Take 100 mg by mouth 2 (two) times daily.     amiodarone (PACERONE) 200 MG tablet Take 1 tablet (200 mg total) by mouth daily. 90 tablet 3   aspirin EC 81 MG tablet Take 81 mg by mouth daily.     atorvastatin (LIPITOR) 40 MG tablet Take 40 mg by mouth daily.     carvedilol (COREG) 3.125 MG tablet Take 3.125 mg by mouth 2 (two) times daily.     clopidogrel (PLAVIX) 75 MG tablet Take 75 mg by mouth daily.     escitalopram (LEXAPRO) 5 MG tablet Take 5 mg by mouth daily.     furosemide (LASIX) 40 MG tablet TAKE 1 AND 1/2 TAB BY MOUTH EVERY MORNING. AND 1 TAB IN THE EVENING 225 tablet 1   gabapentin (NEURONTIN) 300 MG capsule Take 300 mg by mouth 2 (two) times  daily.      glipiZIDE (GLUCOTROL XL) 5 MG 24 hr tablet Take 5 mg by mouth daily.     lisinopril (ZESTRIL) 2.5 MG tablet Take 2.5 mg by mouth daily.     methocarbamol (ROBAXIN) 500 MG tablet Take 500 mg by mouth 2 (two) times daily.     metolazone (ZAROXOLYN) 5 MG tablet Take 5 mg by mouth.     metoprolol succinate (TOPROL-XL) 25 MG 24 hr tablet Take 12.5 mg by mouth daily.     omeprazole (PRILOSEC) 40 MG capsule Take 40 mg by mouth daily.     pantoprazole (PROTONIX) 40 MG tablet Take 1 tablet (40 mg total) by mouth daily. 30 tablet 1   Potassium Chloride ER 20 MEQ TBCR Take 20 mEq by mouth 2 (two) times daily. 60 tablet 3   tiZANidine (ZANAFLEX) 2 MG tablet Take 2 mg by mouth 2 (two) times daily.     traZODone (DESYREL) 50 MG tablet Take 50 mg by mouth.     TRESIBA FLEXTOUCH 200 UNIT/ML FlexTouch Pen Inject 45 Units into the skin daily.      No current facility-administered medications on file prior to visit.   No Known Allergies  No results found for this or any previous visit (from the past 2160 hour(s)).  Objective: General: Patient is awake, alert, and oriented x 3 and in no  acute distress.  Integument: Skin is warm, dry and supple bilateral. Nails are tender, long, thickened and dystrophic with subungual debris, consistent with onychomycosis, 1-5 bilateral.  Vasculature:  Dorsalis Pedis pulse 1/4 bilateral. Posterior Tibial pulse  0/4 bilateral. Capillary fill time <5 sec 1-5 bilateral. No hair growth to the level of the digits.Temperature gradient within normal limits.  Trophic skin changes present bilateral. 1+ pitting edema present bilateral.   Neurology: The patient has absent sensation measured with a 5.07/10g Semmes Weinstein Monofilament at all pedal sites bilateral . Vibratory sensation absent bilateral with tuning fork. No Babinski sign present bilateral.   Musculoskeletal: Asymptomatic pes planus pedal and minimal hammertoe deformities noted bilateral. Muscular strength 5/5  in all lower extremity muscular groups bilateral without pain on range of motion. No tenderness with calf compression bilateral.  Assessment and Plan: Problem List Items Addressed This Visit       Endocrine   Type 2 diabetes mellitus without complication, with long-term current use of insulin ([place])   Other Visit Diagnoses     Pain due to onychomycosis of toenails of both feet    -  Primary   PVD (peripheral vascular disease) ([place])           -Examined patient. -Re-Discussed and educated patient on diabetic foot care, especially with  regards to the vascular, neurological and musculoskeletal systems.  -Mechanically debrided all nails 1-5 bilateral using sterile nail nipper and filed with dremel without incident  -Answered all patient questions -Patient to return  in 3 months for at risk foot care and as scheduled for diabetic shoes -Patient advised to call the office if any problems or questions arise in the meantime.  Landis Martins, DPM

## 2021-01-03 DIAGNOSIS — M6281 Muscle weakness (generalized): Secondary | ICD-10-CM | POA: Diagnosis not present

## 2021-01-03 DIAGNOSIS — R2689 Other abnormalities of gait and mobility: Secondary | ICD-10-CM | POA: Diagnosis not present

## 2021-01-06 DIAGNOSIS — R2689 Other abnormalities of gait and mobility: Secondary | ICD-10-CM | POA: Diagnosis not present

## 2021-01-06 DIAGNOSIS — M6281 Muscle weakness (generalized): Secondary | ICD-10-CM | POA: Diagnosis not present

## 2021-01-06 NOTE — Telephone Encounter (Signed)
Left message #3

## 2021-01-11 NOTE — Telephone Encounter (Signed)
Patient has not returned calls to schedule. Closing encounter.

## 2021-01-12 DIAGNOSIS — M6281 Muscle weakness (generalized): Secondary | ICD-10-CM | POA: Diagnosis not present

## 2021-01-12 DIAGNOSIS — R2689 Other abnormalities of gait and mobility: Secondary | ICD-10-CM | POA: Diagnosis not present

## 2021-01-14 DIAGNOSIS — R2689 Other abnormalities of gait and mobility: Secondary | ICD-10-CM | POA: Diagnosis not present

## 2021-01-14 DIAGNOSIS — M6281 Muscle weakness (generalized): Secondary | ICD-10-CM | POA: Diagnosis not present

## 2021-01-31 DIAGNOSIS — N179 Acute kidney failure, unspecified: Secondary | ICD-10-CM | POA: Diagnosis not present

## 2021-01-31 DIAGNOSIS — E876 Hypokalemia: Secondary | ICD-10-CM | POA: Diagnosis not present

## 2021-01-31 DIAGNOSIS — R161 Splenomegaly, not elsewhere classified: Secondary | ICD-10-CM | POA: Diagnosis not present

## 2021-01-31 DIAGNOSIS — K7460 Unspecified cirrhosis of liver: Secondary | ICD-10-CM | POA: Diagnosis not present

## 2021-01-31 DIAGNOSIS — E860 Dehydration: Secondary | ICD-10-CM | POA: Diagnosis not present

## 2021-01-31 DIAGNOSIS — R531 Weakness: Secondary | ICD-10-CM | POA: Diagnosis not present

## 2021-02-01 DIAGNOSIS — R161 Splenomegaly, not elsewhere classified: Secondary | ICD-10-CM | POA: Diagnosis not present

## 2021-02-01 DIAGNOSIS — M79606 Pain in leg, unspecified: Secondary | ICD-10-CM | POA: Diagnosis not present

## 2021-02-01 DIAGNOSIS — N281 Cyst of kidney, acquired: Secondary | ICD-10-CM | POA: Diagnosis not present

## 2021-02-01 DIAGNOSIS — R001 Bradycardia, unspecified: Secondary | ICD-10-CM | POA: Diagnosis not present

## 2021-02-01 DIAGNOSIS — N183 Chronic kidney disease, stage 3 unspecified: Secondary | ICD-10-CM | POA: Diagnosis not present

## 2021-02-01 DIAGNOSIS — Z794 Long term (current) use of insulin: Secondary | ICD-10-CM | POA: Diagnosis not present

## 2021-02-01 DIAGNOSIS — K7460 Unspecified cirrhosis of liver: Secondary | ICD-10-CM | POA: Diagnosis not present

## 2021-02-01 DIAGNOSIS — I129 Hypertensive chronic kidney disease with stage 1 through stage 4 chronic kidney disease, or unspecified chronic kidney disease: Secondary | ICD-10-CM | POA: Diagnosis not present

## 2021-02-01 DIAGNOSIS — N390 Urinary tract infection, site not specified: Secondary | ICD-10-CM | POA: Diagnosis not present

## 2021-02-01 DIAGNOSIS — E1122 Type 2 diabetes mellitus with diabetic chronic kidney disease: Secondary | ICD-10-CM | POA: Diagnosis not present

## 2021-02-01 DIAGNOSIS — D631 Anemia in chronic kidney disease: Secondary | ICD-10-CM | POA: Diagnosis not present

## 2021-02-01 DIAGNOSIS — R531 Weakness: Secondary | ICD-10-CM | POA: Diagnosis not present

## 2021-02-01 DIAGNOSIS — N179 Acute kidney failure, unspecified: Secondary | ICD-10-CM | POA: Diagnosis not present

## 2021-02-07 DIAGNOSIS — D631 Anemia in chronic kidney disease: Secondary | ICD-10-CM | POA: Diagnosis not present

## 2021-02-07 DIAGNOSIS — N183 Chronic kidney disease, stage 3 unspecified: Secondary | ICD-10-CM | POA: Diagnosis not present

## 2021-02-07 DIAGNOSIS — K7460 Unspecified cirrhosis of liver: Secondary | ICD-10-CM | POA: Diagnosis not present

## 2021-02-07 DIAGNOSIS — E7800 Pure hypercholesterolemia, unspecified: Secondary | ICD-10-CM | POA: Diagnosis not present

## 2021-02-07 DIAGNOSIS — I129 Hypertensive chronic kidney disease with stage 1 through stage 4 chronic kidney disease, or unspecified chronic kidney disease: Secondary | ICD-10-CM | POA: Diagnosis not present

## 2021-02-07 DIAGNOSIS — J449 Chronic obstructive pulmonary disease, unspecified: Secondary | ICD-10-CM | POA: Diagnosis not present

## 2021-02-07 DIAGNOSIS — I2510 Atherosclerotic heart disease of native coronary artery without angina pectoris: Secondary | ICD-10-CM | POA: Diagnosis not present

## 2021-02-07 DIAGNOSIS — E1122 Type 2 diabetes mellitus with diabetic chronic kidney disease: Secondary | ICD-10-CM | POA: Diagnosis not present

## 2021-02-07 DIAGNOSIS — N179 Acute kidney failure, unspecified: Secondary | ICD-10-CM | POA: Diagnosis not present

## 2021-02-09 ENCOUNTER — Telehealth: Payer: Self-pay

## 2021-02-09 DIAGNOSIS — J449 Chronic obstructive pulmonary disease, unspecified: Secondary | ICD-10-CM | POA: Diagnosis not present

## 2021-02-09 DIAGNOSIS — M25472 Effusion, left ankle: Secondary | ICD-10-CM | POA: Diagnosis not present

## 2021-02-09 DIAGNOSIS — E1140 Type 2 diabetes mellitus with diabetic neuropathy, unspecified: Secondary | ICD-10-CM | POA: Diagnosis not present

## 2021-02-09 DIAGNOSIS — Z7689 Persons encountering health services in other specified circumstances: Secondary | ICD-10-CM | POA: Diagnosis not present

## 2021-02-09 DIAGNOSIS — Z09 Encounter for follow-up examination after completed treatment for conditions other than malignant neoplasm: Secondary | ICD-10-CM | POA: Diagnosis not present

## 2021-02-09 DIAGNOSIS — K7460 Unspecified cirrhosis of liver: Secondary | ICD-10-CM | POA: Diagnosis not present

## 2021-02-09 DIAGNOSIS — N179 Acute kidney failure, unspecified: Secondary | ICD-10-CM | POA: Diagnosis not present

## 2021-02-09 DIAGNOSIS — Z6836 Body mass index (BMI) 36.0-36.9, adult: Secondary | ICD-10-CM | POA: Diagnosis not present

## 2021-02-09 DIAGNOSIS — R600 Localized edema: Secondary | ICD-10-CM | POA: Diagnosis not present

## 2021-02-09 DIAGNOSIS — E1122 Type 2 diabetes mellitus with diabetic chronic kidney disease: Secondary | ICD-10-CM | POA: Diagnosis not present

## 2021-02-09 DIAGNOSIS — D631 Anemia in chronic kidney disease: Secondary | ICD-10-CM | POA: Diagnosis not present

## 2021-02-09 DIAGNOSIS — E7800 Pure hypercholesterolemia, unspecified: Secondary | ICD-10-CM | POA: Diagnosis not present

## 2021-02-09 DIAGNOSIS — M25471 Effusion, right ankle: Secondary | ICD-10-CM | POA: Diagnosis not present

## 2021-02-09 DIAGNOSIS — N183 Chronic kidney disease, stage 3 unspecified: Secondary | ICD-10-CM | POA: Diagnosis not present

## 2021-02-09 DIAGNOSIS — I2510 Atherosclerotic heart disease of native coronary artery without angina pectoris: Secondary | ICD-10-CM | POA: Diagnosis not present

## 2021-02-09 DIAGNOSIS — I129 Hypertensive chronic kidney disease with stage 1 through stage 4 chronic kidney disease, or unspecified chronic kidney disease: Secondary | ICD-10-CM | POA: Diagnosis not present

## 2021-02-09 NOTE — Telephone Encounter (Signed)
Angela Nevin calling to see if pt is supposed to be taking amiodarone. After reviewing Dr. Terrial Rhodes notes pt is to take Amiodarone 200 mg once daily. Angela Nevin will have pt reach out to set up an appointment for 6 month follow up.

## 2021-02-11 DIAGNOSIS — E1122 Type 2 diabetes mellitus with diabetic chronic kidney disease: Secondary | ICD-10-CM | POA: Diagnosis not present

## 2021-02-11 DIAGNOSIS — I129 Hypertensive chronic kidney disease with stage 1 through stage 4 chronic kidney disease, or unspecified chronic kidney disease: Secondary | ICD-10-CM | POA: Diagnosis not present

## 2021-02-11 DIAGNOSIS — K7460 Unspecified cirrhosis of liver: Secondary | ICD-10-CM | POA: Diagnosis not present

## 2021-02-11 DIAGNOSIS — N179 Acute kidney failure, unspecified: Secondary | ICD-10-CM | POA: Diagnosis not present

## 2021-02-11 DIAGNOSIS — I2510 Atherosclerotic heart disease of native coronary artery without angina pectoris: Secondary | ICD-10-CM | POA: Diagnosis not present

## 2021-02-11 DIAGNOSIS — D631 Anemia in chronic kidney disease: Secondary | ICD-10-CM | POA: Diagnosis not present

## 2021-02-11 DIAGNOSIS — N183 Chronic kidney disease, stage 3 unspecified: Secondary | ICD-10-CM | POA: Diagnosis not present

## 2021-02-11 DIAGNOSIS — J449 Chronic obstructive pulmonary disease, unspecified: Secondary | ICD-10-CM | POA: Diagnosis not present

## 2021-02-11 DIAGNOSIS — E7800 Pure hypercholesterolemia, unspecified: Secondary | ICD-10-CM | POA: Diagnosis not present

## 2021-02-12 DIAGNOSIS — M79661 Pain in right lower leg: Secondary | ICD-10-CM | POA: Diagnosis not present

## 2021-02-14 DIAGNOSIS — D631 Anemia in chronic kidney disease: Secondary | ICD-10-CM | POA: Diagnosis not present

## 2021-02-14 DIAGNOSIS — E1122 Type 2 diabetes mellitus with diabetic chronic kidney disease: Secondary | ICD-10-CM | POA: Diagnosis not present

## 2021-02-14 DIAGNOSIS — N179 Acute kidney failure, unspecified: Secondary | ICD-10-CM | POA: Diagnosis not present

## 2021-02-14 DIAGNOSIS — N183 Chronic kidney disease, stage 3 unspecified: Secondary | ICD-10-CM | POA: Diagnosis not present

## 2021-02-14 DIAGNOSIS — K7460 Unspecified cirrhosis of liver: Secondary | ICD-10-CM | POA: Diagnosis not present

## 2021-02-14 DIAGNOSIS — J449 Chronic obstructive pulmonary disease, unspecified: Secondary | ICD-10-CM | POA: Diagnosis not present

## 2021-02-14 DIAGNOSIS — I2510 Atherosclerotic heart disease of native coronary artery without angina pectoris: Secondary | ICD-10-CM | POA: Diagnosis not present

## 2021-02-14 DIAGNOSIS — I129 Hypertensive chronic kidney disease with stage 1 through stage 4 chronic kidney disease, or unspecified chronic kidney disease: Secondary | ICD-10-CM | POA: Diagnosis not present

## 2021-02-14 DIAGNOSIS — E7800 Pure hypercholesterolemia, unspecified: Secondary | ICD-10-CM | POA: Diagnosis not present

## 2021-02-15 DIAGNOSIS — E1122 Type 2 diabetes mellitus with diabetic chronic kidney disease: Secondary | ICD-10-CM | POA: Diagnosis not present

## 2021-02-15 DIAGNOSIS — D631 Anemia in chronic kidney disease: Secondary | ICD-10-CM | POA: Diagnosis not present

## 2021-02-15 DIAGNOSIS — N179 Acute kidney failure, unspecified: Secondary | ICD-10-CM | POA: Diagnosis not present

## 2021-02-15 DIAGNOSIS — J449 Chronic obstructive pulmonary disease, unspecified: Secondary | ICD-10-CM | POA: Diagnosis not present

## 2021-02-15 DIAGNOSIS — N183 Chronic kidney disease, stage 3 unspecified: Secondary | ICD-10-CM | POA: Diagnosis not present

## 2021-02-15 DIAGNOSIS — I2510 Atherosclerotic heart disease of native coronary artery without angina pectoris: Secondary | ICD-10-CM | POA: Diagnosis not present

## 2021-02-15 DIAGNOSIS — I129 Hypertensive chronic kidney disease with stage 1 through stage 4 chronic kidney disease, or unspecified chronic kidney disease: Secondary | ICD-10-CM | POA: Diagnosis not present

## 2021-02-15 DIAGNOSIS — K7460 Unspecified cirrhosis of liver: Secondary | ICD-10-CM | POA: Diagnosis not present

## 2021-02-15 DIAGNOSIS — E7800 Pure hypercholesterolemia, unspecified: Secondary | ICD-10-CM | POA: Diagnosis not present

## 2021-02-16 DIAGNOSIS — M25471 Effusion, right ankle: Secondary | ICD-10-CM | POA: Diagnosis not present

## 2021-02-16 DIAGNOSIS — R600 Localized edema: Secondary | ICD-10-CM | POA: Diagnosis not present

## 2021-02-16 DIAGNOSIS — Z6835 Body mass index (BMI) 35.0-35.9, adult: Secondary | ICD-10-CM | POA: Diagnosis not present

## 2021-02-16 DIAGNOSIS — L03115 Cellulitis of right lower limb: Secondary | ICD-10-CM | POA: Diagnosis not present

## 2021-02-16 DIAGNOSIS — M25472 Effusion, left ankle: Secondary | ICD-10-CM | POA: Diagnosis not present

## 2021-02-17 DIAGNOSIS — N179 Acute kidney failure, unspecified: Secondary | ICD-10-CM | POA: Diagnosis not present

## 2021-02-17 DIAGNOSIS — I129 Hypertensive chronic kidney disease with stage 1 through stage 4 chronic kidney disease, or unspecified chronic kidney disease: Secondary | ICD-10-CM | POA: Diagnosis not present

## 2021-02-17 DIAGNOSIS — E7800 Pure hypercholesterolemia, unspecified: Secondary | ICD-10-CM | POA: Diagnosis not present

## 2021-02-17 DIAGNOSIS — K7460 Unspecified cirrhosis of liver: Secondary | ICD-10-CM | POA: Diagnosis not present

## 2021-02-17 DIAGNOSIS — J449 Chronic obstructive pulmonary disease, unspecified: Secondary | ICD-10-CM | POA: Diagnosis not present

## 2021-02-17 DIAGNOSIS — I2510 Atherosclerotic heart disease of native coronary artery without angina pectoris: Secondary | ICD-10-CM | POA: Diagnosis not present

## 2021-02-17 DIAGNOSIS — E1122 Type 2 diabetes mellitus with diabetic chronic kidney disease: Secondary | ICD-10-CM | POA: Diagnosis not present

## 2021-02-17 DIAGNOSIS — N183 Chronic kidney disease, stage 3 unspecified: Secondary | ICD-10-CM | POA: Diagnosis not present

## 2021-02-17 DIAGNOSIS — D631 Anemia in chronic kidney disease: Secondary | ICD-10-CM | POA: Diagnosis not present

## 2021-02-21 DIAGNOSIS — J449 Chronic obstructive pulmonary disease, unspecified: Secondary | ICD-10-CM | POA: Diagnosis not present

## 2021-02-21 DIAGNOSIS — D631 Anemia in chronic kidney disease: Secondary | ICD-10-CM | POA: Diagnosis not present

## 2021-02-21 DIAGNOSIS — N183 Chronic kidney disease, stage 3 unspecified: Secondary | ICD-10-CM | POA: Diagnosis not present

## 2021-02-21 DIAGNOSIS — N179 Acute kidney failure, unspecified: Secondary | ICD-10-CM | POA: Diagnosis not present

## 2021-02-21 DIAGNOSIS — K7460 Unspecified cirrhosis of liver: Secondary | ICD-10-CM | POA: Diagnosis not present

## 2021-02-21 DIAGNOSIS — E1122 Type 2 diabetes mellitus with diabetic chronic kidney disease: Secondary | ICD-10-CM | POA: Diagnosis not present

## 2021-02-21 DIAGNOSIS — E7800 Pure hypercholesterolemia, unspecified: Secondary | ICD-10-CM | POA: Diagnosis not present

## 2021-02-21 DIAGNOSIS — I2510 Atherosclerotic heart disease of native coronary artery without angina pectoris: Secondary | ICD-10-CM | POA: Diagnosis not present

## 2021-02-21 DIAGNOSIS — I129 Hypertensive chronic kidney disease with stage 1 through stage 4 chronic kidney disease, or unspecified chronic kidney disease: Secondary | ICD-10-CM | POA: Diagnosis not present

## 2021-02-22 DIAGNOSIS — M1990 Unspecified osteoarthritis, unspecified site: Secondary | ICD-10-CM | POA: Diagnosis not present

## 2021-02-22 DIAGNOSIS — K219 Gastro-esophageal reflux disease without esophagitis: Secondary | ICD-10-CM | POA: Diagnosis not present

## 2021-02-22 DIAGNOSIS — J449 Chronic obstructive pulmonary disease, unspecified: Secondary | ICD-10-CM | POA: Diagnosis not present

## 2021-02-22 DIAGNOSIS — I509 Heart failure, unspecified: Secondary | ICD-10-CM | POA: Diagnosis not present

## 2021-02-22 DIAGNOSIS — D509 Iron deficiency anemia, unspecified: Secondary | ICD-10-CM | POA: Diagnosis not present

## 2021-02-22 DIAGNOSIS — E785 Hyperlipidemia, unspecified: Secondary | ICD-10-CM | POA: Diagnosis not present

## 2021-02-22 DIAGNOSIS — G4700 Insomnia, unspecified: Secondary | ICD-10-CM | POA: Diagnosis not present

## 2021-02-22 DIAGNOSIS — E1140 Type 2 diabetes mellitus with diabetic neuropathy, unspecified: Secondary | ICD-10-CM | POA: Diagnosis not present

## 2021-02-22 DIAGNOSIS — E1169 Type 2 diabetes mellitus with other specified complication: Secondary | ICD-10-CM | POA: Diagnosis not present

## 2021-02-23 DIAGNOSIS — N183 Chronic kidney disease, stage 3 unspecified: Secondary | ICD-10-CM | POA: Diagnosis not present

## 2021-02-23 DIAGNOSIS — E1122 Type 2 diabetes mellitus with diabetic chronic kidney disease: Secondary | ICD-10-CM | POA: Diagnosis not present

## 2021-02-23 DIAGNOSIS — N179 Acute kidney failure, unspecified: Secondary | ICD-10-CM | POA: Diagnosis not present

## 2021-02-23 DIAGNOSIS — I129 Hypertensive chronic kidney disease with stage 1 through stage 4 chronic kidney disease, or unspecified chronic kidney disease: Secondary | ICD-10-CM | POA: Diagnosis not present

## 2021-02-23 DIAGNOSIS — I2510 Atherosclerotic heart disease of native coronary artery without angina pectoris: Secondary | ICD-10-CM | POA: Diagnosis not present

## 2021-02-23 DIAGNOSIS — N1832 Chronic kidney disease, stage 3b: Secondary | ICD-10-CM | POA: Diagnosis not present

## 2021-02-23 DIAGNOSIS — I10 Essential (primary) hypertension: Secondary | ICD-10-CM | POA: Diagnosis not present

## 2021-02-23 DIAGNOSIS — E1140 Type 2 diabetes mellitus with diabetic neuropathy, unspecified: Secondary | ICD-10-CM | POA: Diagnosis not present

## 2021-02-23 DIAGNOSIS — D631 Anemia in chronic kidney disease: Secondary | ICD-10-CM | POA: Diagnosis not present

## 2021-02-23 DIAGNOSIS — E7800 Pure hypercholesterolemia, unspecified: Secondary | ICD-10-CM | POA: Diagnosis not present

## 2021-02-23 DIAGNOSIS — J449 Chronic obstructive pulmonary disease, unspecified: Secondary | ICD-10-CM | POA: Diagnosis not present

## 2021-02-23 DIAGNOSIS — K7460 Unspecified cirrhosis of liver: Secondary | ICD-10-CM | POA: Diagnosis not present

## 2021-02-24 DIAGNOSIS — E1122 Type 2 diabetes mellitus with diabetic chronic kidney disease: Secondary | ICD-10-CM | POA: Diagnosis not present

## 2021-02-24 DIAGNOSIS — K7460 Unspecified cirrhosis of liver: Secondary | ICD-10-CM | POA: Diagnosis not present

## 2021-02-24 DIAGNOSIS — D631 Anemia in chronic kidney disease: Secondary | ICD-10-CM | POA: Diagnosis not present

## 2021-02-24 DIAGNOSIS — J449 Chronic obstructive pulmonary disease, unspecified: Secondary | ICD-10-CM | POA: Diagnosis not present

## 2021-02-24 DIAGNOSIS — E7800 Pure hypercholesterolemia, unspecified: Secondary | ICD-10-CM | POA: Diagnosis not present

## 2021-02-24 DIAGNOSIS — I129 Hypertensive chronic kidney disease with stage 1 through stage 4 chronic kidney disease, or unspecified chronic kidney disease: Secondary | ICD-10-CM | POA: Diagnosis not present

## 2021-02-24 DIAGNOSIS — I2510 Atherosclerotic heart disease of native coronary artery without angina pectoris: Secondary | ICD-10-CM | POA: Diagnosis not present

## 2021-02-24 DIAGNOSIS — N179 Acute kidney failure, unspecified: Secondary | ICD-10-CM | POA: Diagnosis not present

## 2021-02-24 DIAGNOSIS — N183 Chronic kidney disease, stage 3 unspecified: Secondary | ICD-10-CM | POA: Diagnosis not present

## 2021-02-25 DIAGNOSIS — D631 Anemia in chronic kidney disease: Secondary | ICD-10-CM | POA: Diagnosis not present

## 2021-02-25 DIAGNOSIS — I2510 Atherosclerotic heart disease of native coronary artery without angina pectoris: Secondary | ICD-10-CM | POA: Diagnosis not present

## 2021-02-25 DIAGNOSIS — N179 Acute kidney failure, unspecified: Secondary | ICD-10-CM | POA: Diagnosis not present

## 2021-02-25 DIAGNOSIS — K7460 Unspecified cirrhosis of liver: Secondary | ICD-10-CM | POA: Diagnosis not present

## 2021-02-25 DIAGNOSIS — N183 Chronic kidney disease, stage 3 unspecified: Secondary | ICD-10-CM | POA: Diagnosis not present

## 2021-02-25 DIAGNOSIS — I129 Hypertensive chronic kidney disease with stage 1 through stage 4 chronic kidney disease, or unspecified chronic kidney disease: Secondary | ICD-10-CM | POA: Diagnosis not present

## 2021-02-25 DIAGNOSIS — J449 Chronic obstructive pulmonary disease, unspecified: Secondary | ICD-10-CM | POA: Diagnosis not present

## 2021-02-25 DIAGNOSIS — E7800 Pure hypercholesterolemia, unspecified: Secondary | ICD-10-CM | POA: Diagnosis not present

## 2021-02-25 DIAGNOSIS — E1122 Type 2 diabetes mellitus with diabetic chronic kidney disease: Secondary | ICD-10-CM | POA: Diagnosis not present

## 2021-02-28 DIAGNOSIS — E7800 Pure hypercholesterolemia, unspecified: Secondary | ICD-10-CM | POA: Diagnosis not present

## 2021-02-28 DIAGNOSIS — I2510 Atherosclerotic heart disease of native coronary artery without angina pectoris: Secondary | ICD-10-CM | POA: Diagnosis not present

## 2021-02-28 DIAGNOSIS — I129 Hypertensive chronic kidney disease with stage 1 through stage 4 chronic kidney disease, or unspecified chronic kidney disease: Secondary | ICD-10-CM | POA: Diagnosis not present

## 2021-02-28 DIAGNOSIS — N183 Chronic kidney disease, stage 3 unspecified: Secondary | ICD-10-CM | POA: Diagnosis not present

## 2021-02-28 DIAGNOSIS — N179 Acute kidney failure, unspecified: Secondary | ICD-10-CM | POA: Diagnosis not present

## 2021-02-28 DIAGNOSIS — J449 Chronic obstructive pulmonary disease, unspecified: Secondary | ICD-10-CM | POA: Diagnosis not present

## 2021-02-28 DIAGNOSIS — K7460 Unspecified cirrhosis of liver: Secondary | ICD-10-CM | POA: Diagnosis not present

## 2021-02-28 DIAGNOSIS — E1122 Type 2 diabetes mellitus with diabetic chronic kidney disease: Secondary | ICD-10-CM | POA: Diagnosis not present

## 2021-02-28 DIAGNOSIS — D631 Anemia in chronic kidney disease: Secondary | ICD-10-CM | POA: Diagnosis not present

## 2021-03-03 DIAGNOSIS — I129 Hypertensive chronic kidney disease with stage 1 through stage 4 chronic kidney disease, or unspecified chronic kidney disease: Secondary | ICD-10-CM | POA: Diagnosis not present

## 2021-03-03 DIAGNOSIS — Z0000 Encounter for general adult medical examination without abnormal findings: Secondary | ICD-10-CM | POA: Diagnosis not present

## 2021-03-03 DIAGNOSIS — E669 Obesity, unspecified: Secondary | ICD-10-CM | POA: Diagnosis not present

## 2021-03-03 DIAGNOSIS — E1122 Type 2 diabetes mellitus with diabetic chronic kidney disease: Secondary | ICD-10-CM | POA: Diagnosis not present

## 2021-03-03 DIAGNOSIS — Z9181 History of falling: Secondary | ICD-10-CM | POA: Diagnosis not present

## 2021-03-03 DIAGNOSIS — E785 Hyperlipidemia, unspecified: Secondary | ICD-10-CM | POA: Diagnosis not present

## 2021-03-03 DIAGNOSIS — N183 Chronic kidney disease, stage 3 unspecified: Secondary | ICD-10-CM | POA: Diagnosis not present

## 2021-03-03 DIAGNOSIS — N179 Acute kidney failure, unspecified: Secondary | ICD-10-CM | POA: Diagnosis not present

## 2021-03-03 DIAGNOSIS — J449 Chronic obstructive pulmonary disease, unspecified: Secondary | ICD-10-CM | POA: Diagnosis not present

## 2021-03-03 DIAGNOSIS — D631 Anemia in chronic kidney disease: Secondary | ICD-10-CM | POA: Diagnosis not present

## 2021-03-03 DIAGNOSIS — K7460 Unspecified cirrhosis of liver: Secondary | ICD-10-CM | POA: Diagnosis not present

## 2021-03-03 DIAGNOSIS — E7800 Pure hypercholesterolemia, unspecified: Secondary | ICD-10-CM | POA: Diagnosis not present

## 2021-03-03 DIAGNOSIS — I2510 Atherosclerotic heart disease of native coronary artery without angina pectoris: Secondary | ICD-10-CM | POA: Diagnosis not present

## 2021-03-03 DIAGNOSIS — Z6833 Body mass index (BMI) 33.0-33.9, adult: Secondary | ICD-10-CM | POA: Diagnosis not present

## 2021-03-03 DIAGNOSIS — Z1331 Encounter for screening for depression: Secondary | ICD-10-CM | POA: Diagnosis not present

## 2021-03-04 DIAGNOSIS — N183 Chronic kidney disease, stage 3 unspecified: Secondary | ICD-10-CM | POA: Diagnosis not present

## 2021-03-04 DIAGNOSIS — I129 Hypertensive chronic kidney disease with stage 1 through stage 4 chronic kidney disease, or unspecified chronic kidney disease: Secondary | ICD-10-CM | POA: Diagnosis not present

## 2021-03-04 DIAGNOSIS — I2510 Atherosclerotic heart disease of native coronary artery without angina pectoris: Secondary | ICD-10-CM | POA: Diagnosis not present

## 2021-03-04 DIAGNOSIS — D631 Anemia in chronic kidney disease: Secondary | ICD-10-CM | POA: Diagnosis not present

## 2021-03-04 DIAGNOSIS — N179 Acute kidney failure, unspecified: Secondary | ICD-10-CM | POA: Diagnosis not present

## 2021-03-04 DIAGNOSIS — K7460 Unspecified cirrhosis of liver: Secondary | ICD-10-CM | POA: Diagnosis not present

## 2021-03-04 DIAGNOSIS — E1122 Type 2 diabetes mellitus with diabetic chronic kidney disease: Secondary | ICD-10-CM | POA: Diagnosis not present

## 2021-03-04 DIAGNOSIS — E7800 Pure hypercholesterolemia, unspecified: Secondary | ICD-10-CM | POA: Diagnosis not present

## 2021-03-04 DIAGNOSIS — J449 Chronic obstructive pulmonary disease, unspecified: Secondary | ICD-10-CM | POA: Diagnosis not present

## 2021-03-08 ENCOUNTER — Telehealth: Payer: Self-pay | Admitting: Cardiology

## 2021-03-08 DIAGNOSIS — I509 Heart failure, unspecified: Secondary | ICD-10-CM | POA: Diagnosis not present

## 2021-03-08 DIAGNOSIS — N183 Chronic kidney disease, stage 3 unspecified: Secondary | ICD-10-CM | POA: Diagnosis not present

## 2021-03-08 DIAGNOSIS — E1122 Type 2 diabetes mellitus with diabetic chronic kidney disease: Secondary | ICD-10-CM | POA: Diagnosis not present

## 2021-03-08 DIAGNOSIS — E7800 Pure hypercholesterolemia, unspecified: Secondary | ICD-10-CM | POA: Diagnosis not present

## 2021-03-08 DIAGNOSIS — K7460 Unspecified cirrhosis of liver: Secondary | ICD-10-CM | POA: Diagnosis not present

## 2021-03-08 DIAGNOSIS — I959 Hypotension, unspecified: Secondary | ICD-10-CM | POA: Diagnosis not present

## 2021-03-08 DIAGNOSIS — D631 Anemia in chronic kidney disease: Secondary | ICD-10-CM | POA: Diagnosis not present

## 2021-03-08 DIAGNOSIS — N179 Acute kidney failure, unspecified: Secondary | ICD-10-CM | POA: Diagnosis not present

## 2021-03-08 DIAGNOSIS — I129 Hypertensive chronic kidney disease with stage 1 through stage 4 chronic kidney disease, or unspecified chronic kidney disease: Secondary | ICD-10-CM | POA: Diagnosis not present

## 2021-03-08 DIAGNOSIS — Z6833 Body mass index (BMI) 33.0-33.9, adult: Secondary | ICD-10-CM | POA: Diagnosis not present

## 2021-03-08 DIAGNOSIS — J449 Chronic obstructive pulmonary disease, unspecified: Secondary | ICD-10-CM | POA: Diagnosis not present

## 2021-03-08 DIAGNOSIS — I2510 Atherosclerotic heart disease of native coronary artery without angina pectoris: Secondary | ICD-10-CM | POA: Diagnosis not present

## 2021-03-08 NOTE — Telephone Encounter (Signed)
Spoke to the patient just now and got him scheduled to see Dr. Bettina Gavia next week. I also let Dr. Sharee Pimple know this as well.

## 2021-03-08 NOTE — Telephone Encounter (Signed)
Dr. [name] from [facility] calling to see if the patient can be seen sooner or if the office has any medical advice for the patient until his appointment 12/20. She says it is regarding fluid, HR, and low BP. She says she had to switch his dieretic, because the lasix was not working, but the new medication is worse on his kidneys. She states his pulse is in the low 60's and his BP has been 90/40. She says she stopped some other BP medications, but the only think left carvedilol, and based on his medical history it looks like he needs to stay on it. She says she is looking for help. Her direct cell phone: [phone]

## 2021-03-09 DIAGNOSIS — D631 Anemia in chronic kidney disease: Secondary | ICD-10-CM | POA: Diagnosis not present

## 2021-03-09 DIAGNOSIS — I129 Hypertensive chronic kidney disease with stage 1 through stage 4 chronic kidney disease, or unspecified chronic kidney disease: Secondary | ICD-10-CM | POA: Diagnosis not present

## 2021-03-09 DIAGNOSIS — I2510 Atherosclerotic heart disease of native coronary artery without angina pectoris: Secondary | ICD-10-CM | POA: Diagnosis not present

## 2021-03-09 DIAGNOSIS — J449 Chronic obstructive pulmonary disease, unspecified: Secondary | ICD-10-CM | POA: Diagnosis not present

## 2021-03-09 DIAGNOSIS — E1122 Type 2 diabetes mellitus with diabetic chronic kidney disease: Secondary | ICD-10-CM | POA: Diagnosis not present

## 2021-03-09 DIAGNOSIS — N183 Chronic kidney disease, stage 3 unspecified: Secondary | ICD-10-CM | POA: Diagnosis not present

## 2021-03-09 DIAGNOSIS — E7800 Pure hypercholesterolemia, unspecified: Secondary | ICD-10-CM | POA: Diagnosis not present

## 2021-03-09 DIAGNOSIS — K7460 Unspecified cirrhosis of liver: Secondary | ICD-10-CM | POA: Diagnosis not present

## 2021-03-09 DIAGNOSIS — N179 Acute kidney failure, unspecified: Secondary | ICD-10-CM | POA: Diagnosis not present

## 2021-03-10 DIAGNOSIS — E7800 Pure hypercholesterolemia, unspecified: Secondary | ICD-10-CM | POA: Diagnosis not present

## 2021-03-10 DIAGNOSIS — D631 Anemia in chronic kidney disease: Secondary | ICD-10-CM | POA: Diagnosis not present

## 2021-03-10 DIAGNOSIS — E1122 Type 2 diabetes mellitus with diabetic chronic kidney disease: Secondary | ICD-10-CM | POA: Diagnosis not present

## 2021-03-10 DIAGNOSIS — N179 Acute kidney failure, unspecified: Secondary | ICD-10-CM | POA: Diagnosis not present

## 2021-03-10 DIAGNOSIS — J449 Chronic obstructive pulmonary disease, unspecified: Secondary | ICD-10-CM | POA: Diagnosis not present

## 2021-03-10 DIAGNOSIS — K7460 Unspecified cirrhosis of liver: Secondary | ICD-10-CM | POA: Diagnosis not present

## 2021-03-10 DIAGNOSIS — I2510 Atherosclerotic heart disease of native coronary artery without angina pectoris: Secondary | ICD-10-CM | POA: Diagnosis not present

## 2021-03-10 DIAGNOSIS — N183 Chronic kidney disease, stage 3 unspecified: Secondary | ICD-10-CM | POA: Diagnosis not present

## 2021-03-10 DIAGNOSIS — I129 Hypertensive chronic kidney disease with stage 1 through stage 4 chronic kidney disease, or unspecified chronic kidney disease: Secondary | ICD-10-CM | POA: Diagnosis not present

## 2021-03-11 ENCOUNTER — Other Ambulatory Visit: Payer: Self-pay

## 2021-03-14 DIAGNOSIS — I129 Hypertensive chronic kidney disease with stage 1 through stage 4 chronic kidney disease, or unspecified chronic kidney disease: Secondary | ICD-10-CM | POA: Diagnosis not present

## 2021-03-14 DIAGNOSIS — N390 Urinary tract infection, site not specified: Secondary | ICD-10-CM | POA: Diagnosis not present

## 2021-03-14 DIAGNOSIS — N1832 Chronic kidney disease, stage 3b: Secondary | ICD-10-CM | POA: Diagnosis not present

## 2021-03-14 DIAGNOSIS — I2510 Atherosclerotic heart disease of native coronary artery without angina pectoris: Secondary | ICD-10-CM | POA: Diagnosis not present

## 2021-03-14 DIAGNOSIS — I509 Heart failure, unspecified: Secondary | ICD-10-CM | POA: Diagnosis not present

## 2021-03-14 NOTE — Progress Notes (Signed)
Cardiology Office Note:    Date:  03/15/2021   ID:  Oscar Kim, DOB March 17, 1941, MRN [id number]  PCP:  [name], NP  Cardiologist:  Shirlee More, MD    Referring MD: [name], NP    ASSESSMENT:    1. Hypertensive heart disease with heart failure ([place])   2. Frequent PVCs   3. NSVT (nonsustained ventricular tachycardia)   4. On amiodarone therapy   5. Coronary artery disease involving native coronary artery of native heart without angina pectoris   6. Mixed hyperlipidemia   7. Myopathy    PLAN:    In order of problems listed above:  Is very difficult to piece together we will request those records from [facility] and sounds like he has been in congestive heart failure was given more intense diuretics and now is very weak.  He has no fluid overload his son will bring a list of medicines back to the office but if he is taking metolazone will take it only if his weight is up 20 pounds from baseline 209 or greater.  We also need to look at his potassium dosage with his CKD.  I think that any blood pressure issues will come and By decreasing his diuretics Stable continue amiodarone check his thyroids today at risk for hypothyroidism and muscle weakness Stable CAD continue current treatment including his dual antiplatelet and beta-blocker Stop his statin he is at risk for myopathy   Next appointment: 3 months   Medication Adjustments/Labs and Tests Ordered: Current medicines are reviewed at length with the patient today.  Concerns regarding medicines are outlined above.  Orders Placed This Encounter  Procedures   Basic metabolic panel   Pro b natriuretic peptide (BNP)   TSH+T4F+T3Free   EKG 12-Lead   No orders of the defined types were placed in this encounter.   Chief Complaint  Patient presents with   Follow-up    Now on amiodarone for ventricular ectopy    History of Present Illness:    Oscar Kim is a 80 y.o. male with a hx of CAD with  remote PCI and stent 15 years ago peripheral arterial disease with percutaneous revascularization heart failure diastolic EF 60 to 09% ventricular ectopy with nonsustained VT on amiodarone hypertensive heart disease hyperlipidemia diabetes and obesity last seen 09/15/2020.  Compliance with diet, lifestyle and medications: Yes  ZIO monitor The patient wore the monitor for 8 days 14  hours starting  05/20/2020 . Indication: PVC   The minimum heart rate was 41 bpm, maximum heart rate was 171  bpm, and average heart rate was 66 bpm. Predominant underlying rhythm was Sinus Rhythm.   10 Ventricular Tachycardia runs occurred, the run with the fastest interval lasting 7 beats with a max rate of 171 bpm, the longest lasting 7 beats with an average heart rate of 124 bpm.    11 Supraventricular Tachycardia runs occurred, the run with the fastest interval lasting 5 beats with a maximum heart rate of 150 bpm, the longest lasting 16.3 seconds with an average rate of 104 bpm.   Premature atrial complexes were rare. Premature Ventricular complexes were rare.   No pauses, No AV block and no atrial fibrillation present. 3  patient triggered events and 4 diary events all associated with premature ventricular complexes.    Conclusion: This study is remarkable for nonsustained ventricular tachycardia,  paroxysmal atrial tachycardia and rare symptomatic premature ventricular complex.  This is become a very complex visit.  His son  tells me he is at [facility] a month ago came out the door with a 30 pound weight gain his diuretics were changed and he is back to his baseline.  He is not taking furosemide anymore but they are unsure what he is taking 1 tablet a day and they are not quite sure if he is taking metolazone every day.  Regardless his father is very weak he is unable to walk steadily saw nephrologist yesterday who told him that I would not resolve the issue.  Apparently his blood pressure is less than  671 systolic.  No change in his blood pressure medication. He is not having edema shortness of breath chest pain palpitation or syncope He is on amiodarone at risk for hypothyroidism He takes a high intensity statin with his CKD is at risk for myopathy. 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 approximately 15 years ago - [place]   Chronic diastolic heart failure ([place]) 2/45/8099   Chronic systolic CHF (congestive heart failure) ([place]) 8/33/8250   Chronic systolic heart failure (HCC)    Coronary artery disease involving native coronary artery of native heart without angina pectoris 07/23/2019   Dyspnea 07/23/2019   Essential hypertension    Fatigue 07/23/2019   Frequent PVCs 07/23/2019   Hyperlipidemia    Mixed hyperlipidemia 07/23/2019   NSVT (nonsustained ventricular tachycardia) 08/22/2019   Obesity (BMI 30-39.9)    Other general symptoms and signs  09/19/2019   PAD (peripheral artery disease) (HCC)    Previous stent revascularization, left leg - [place]   Type 2 diabetes mellitus ([place])    Type 2 diabetes mellitus without complication, with long-term current use of insulin ([place]) 07/31/2019   Vitamin D deficiency, unspecified  09/19/2019    Past Surgical History:  Procedure Laterality Date   CORONARY ANGIOPLASTY WITH STENT PLACEMENT     When he lived in [place], multiple stents placed   KNEE SURGERY Right     Current Medications: Current Meds  Medication Sig   allopurinol (ZYLOPRIM) 100 MG tablet Take 100 mg by mouth 2 (two) times daily.   amiodarone (PACERONE) 200 MG tablet Take 1 tablet (200 mg total) by mouth daily.   aspirin EC 81 MG tablet Take 81 mg by mouth daily.   carvedilol (COREG) 3.125 MG tablet Take 3.125 mg by mouth 2 (two) times daily.   clopidogrel (PLAVIX) 75 MG tablet Take 75 mg by mouth daily.   escitalopram (LEXAPRO) 5 MG tablet Take 5 mg by mouth daily.   furosemide  (LASIX) 40 MG tablet Take 60 mg by mouth every morning. And take 40 mg in the evening   glipiZIDE (GLUCOTROL XL) 5 MG 24 hr tablet Take 5 mg by mouth daily.   LANTUS SOLOSTAR 100 UNIT/ML Solostar Pen Inject 10-20 Units into the skin daily. Sliding scale   levocetirizine (XYZAL) 5 MG tablet Take 5 mg by mouth daily.   lisinopril (ZESTRIL) 2.5 MG tablet Take 2.5 mg by mouth daily.   methocarbamol (ROBAXIN) 500 MG tablet Take 500 mg by mouth 2 (two) times daily.   metolazone (ZAROXOLYN) 5 MG tablet Take 5 mg by mouth daily as needed. Take if your weight is above 209 lbs or greater.    metoprolol succinate (TOPROL-XL) 25 MG 24 hr tablet Take 12.5 mg by mouth daily.   omeprazole (PRILOSEC) 40 MG capsule Take 40 mg by mouth daily.   pantoprazole (PROTONIX) 40  MG tablet Take 1 tablet (40 mg total) by mouth daily.   Potassium Chloride ER 20 MEQ TBCR Take 20 mEq by mouth 2 (two) times daily.   tiZANidine (ZANAFLEX) 2 MG tablet Take 2 mg by mouth 2 (two) times daily.   traZODone (DESYREL) 50 MG tablet Take 50 mg by mouth daily.   TRESIBA FLEXTOUCH 200 UNIT/ML FlexTouch Pen Inject 45 Units into the skin daily.    [DISCONTINUED] gabapentin (NEURONTIN) 300 MG capsule Take 300 mg by mouth 2 (two) times daily.    [DISCONTINUED] rosuvastatin (CRESTOR) 20 MG tablet Take 20 mg by mouth daily.     Allergies:   Patient has no known allergies.   Social History   Socioeconomic History   Marital status: Married    Spouse name: Not on file   Number of children: Not on file   Years of education: Not on file   Highest education level: Not on file  Occupational History   Not on file  Tobacco Use   Smoking status: Former    Types: Cigarettes    Quit date: 07/23/1979    Years since quitting: 41.6   Smokeless tobacco: Current    Types: Snuff  Vaping Use   Vaping Use: Never used  Substance and Sexual Activity   Alcohol use: Never   Drug use: Never   Sexual activity: Not on file  Other Topics Concern    Not on file  Social History Narrative   Not on file   Social Determinants of Health   Financial Resource Strain: Not on file  Food Insecurity: Not on file  Transportation Needs: Not on file  Physical Activity: Not on file  Stress: Not on file  Social Connections: Not on file     Family History: The patient's family history includes Hypertension in his father. ROS:   Please see the history of present illness.    All other systems reviewed and are negative.  EKGs/Labs/Other Studies Reviewed:    The following studies were reviewed today:  EKG:  EKG ordered today and personally reviewed.  The ekg ordered today demonstrates sinus rhythm rate 59 bpm normal EKG  Recent Labs: 02/22/2021: Hemoglobin 11.9 creatinine 2.31 potassium 5.4 last TSH in August was normal 2.99 05/20/2020: BUN 49; Creatinine, Ser 2.32; Hemoglobin 12.1; Magnesium 1.9; Platelets 221; Potassium 3.9; Sodium 135  Recent Lipid Panel    Component Value Date/Time   CHOL 237 (H) 07/31/2019 0256   TRIG 192 (H) 07/31/2019 0256   HDL 31 (L) 07/31/2019 0256   CHOLHDL 7.6 07/31/2019 0256   VLDL 38 07/31/2019 0256   LDLCALC 168 (H) 07/31/2019 0256    Physical Exam:    VS:  BP (!) 122/52   Pulse (!) 59   Ht 5\' 10"  (1.778 m)   Wt 201 lb 12.8 oz (91.5 kg)   SpO2 98%   BMI 28.96 kg/m     Wt Readings from Last 3 Encounters:  03/15/21 201 lb 12.8 oz (91.5 kg)  09/15/20 213 lb 3.2 oz (96.7 kg)  06/17/20 207 lb (93.9 kg)     GEN: He looks frail well nourished, well developed in no acute distress HEENT: Normal NECK: No JVD; No carotid bruits LYMPHATICS: No lymphadenopathy CARDIAC: RRR, no murmurs, rubs, gallops RESPIRATORY:  Clear to auscultation without rales, wheezing or rhonchi  ABDOMEN: Soft, non-tender, non-distended MUSCULOSKELETAL:  No edema; No deformity  SKIN: Warm and dry NEUROLOGIC:  Alert and oriented x 3 PSYCHIATRIC:  Normal affect  Signed, Shirlee More, MD  03/15/2021 10:28 AM    [place]

## 2021-03-15 ENCOUNTER — Other Ambulatory Visit: Payer: Self-pay

## 2021-03-15 ENCOUNTER — Encounter: Payer: Self-pay | Admitting: Cardiology

## 2021-03-15 ENCOUNTER — Ambulatory Visit: Payer: Medicare HMO | Admitting: Cardiology

## 2021-03-15 VITALS — BP 122/52 | HR 59 | Ht 70.0 in | Wt 201.8 lb

## 2021-03-15 DIAGNOSIS — I4729 Other ventricular tachycardia: Secondary | ICD-10-CM

## 2021-03-15 DIAGNOSIS — I493 Ventricular premature depolarization: Secondary | ICD-10-CM | POA: Diagnosis not present

## 2021-03-15 DIAGNOSIS — E782 Mixed hyperlipidemia: Secondary | ICD-10-CM

## 2021-03-15 DIAGNOSIS — I2510 Atherosclerotic heart disease of native coronary artery without angina pectoris: Secondary | ICD-10-CM | POA: Diagnosis not present

## 2021-03-15 DIAGNOSIS — G729 Myopathy, unspecified: Secondary | ICD-10-CM

## 2021-03-15 DIAGNOSIS — I5020 Unspecified systolic (congestive) heart failure: Secondary | ICD-10-CM

## 2021-03-15 DIAGNOSIS — Z79899 Other long term (current) drug therapy: Secondary | ICD-10-CM

## 2021-03-15 DIAGNOSIS — I110 Hypertensive heart disease with heart failure: Secondary | ICD-10-CM

## 2021-03-15 NOTE — Addendum Note (Signed)
Addended by: Resa Miner I on: 03/15/2021 04:43 PM   Modules accepted: Orders

## 2021-03-15 NOTE — Patient Instructions (Signed)
Medication Instructions:  Your physician has recommended you make the following change in your medication:  STOP: Gabapentin STOP: Rosuvastatin  CHANGE: Metolazone take one tablet by mouth daily if you weigh 209 lbs or greater.  *If you need a refill on your cardiac medications before your next appointment, please call your pharmacy*   Lab Work: Your physician recommends that you return for lab work in: TODAY BMP, ProBNP, TSH, T3, T4 If you have labs (blood work) drawn today and your tests are completely normal, you will receive your results only by: [place] (if you have [place]) OR A paper copy in the mail If you have any lab test that is abnormal or we need to change your treatment, we will call you to review the results.   Testing/Procedures: None   Follow-Up: At [facility], you and your health needs are our priority.  As part of our continuing mission to provide you with exceptional heart care, we have created designated Provider Care Teams.  These Care Teams include your primary Cardiologist (physician) and Advanced Practice Providers (APPs -  Physician Assistants and Nurse Practitioners) who all work together to provide you with the care you need, when you need it.  We recommend signing up for the patient portal called "MyChart".  Sign up information is provided on this After Visit Summary.  MyChart is used to connect with patients for Virtual Visits (Telemedicine).  Patients are able to view lab/test results, encounter notes, upcoming appointments, etc.  Non-urgent messages can be sent to your provider as well.   To learn more about what you can do with MyChart, go to [email or web address].    Your next appointment:   3 month(s)  The format for your next appointment:   In Person  Provider:   Shirlee More, MD    Other Instructions

## 2021-03-16 DIAGNOSIS — E7800 Pure hypercholesterolemia, unspecified: Secondary | ICD-10-CM | POA: Diagnosis not present

## 2021-03-16 DIAGNOSIS — N179 Acute kidney failure, unspecified: Secondary | ICD-10-CM | POA: Diagnosis not present

## 2021-03-16 DIAGNOSIS — D631 Anemia in chronic kidney disease: Secondary | ICD-10-CM | POA: Diagnosis not present

## 2021-03-16 DIAGNOSIS — I2510 Atherosclerotic heart disease of native coronary artery without angina pectoris: Secondary | ICD-10-CM | POA: Diagnosis not present

## 2021-03-16 DIAGNOSIS — J449 Chronic obstructive pulmonary disease, unspecified: Secondary | ICD-10-CM | POA: Diagnosis not present

## 2021-03-16 DIAGNOSIS — N183 Chronic kidney disease, stage 3 unspecified: Secondary | ICD-10-CM | POA: Diagnosis not present

## 2021-03-16 DIAGNOSIS — I129 Hypertensive chronic kidney disease with stage 1 through stage 4 chronic kidney disease, or unspecified chronic kidney disease: Secondary | ICD-10-CM | POA: Diagnosis not present

## 2021-03-16 DIAGNOSIS — K7460 Unspecified cirrhosis of liver: Secondary | ICD-10-CM | POA: Diagnosis not present

## 2021-03-16 DIAGNOSIS — E1122 Type 2 diabetes mellitus with diabetic chronic kidney disease: Secondary | ICD-10-CM | POA: Diagnosis not present

## 2021-03-17 ENCOUNTER — Telehealth: Payer: Self-pay

## 2021-03-17 DIAGNOSIS — D631 Anemia in chronic kidney disease: Secondary | ICD-10-CM | POA: Diagnosis not present

## 2021-03-17 DIAGNOSIS — N179 Acute kidney failure, unspecified: Secondary | ICD-10-CM | POA: Diagnosis not present

## 2021-03-17 DIAGNOSIS — N183 Chronic kidney disease, stage 3 unspecified: Secondary | ICD-10-CM | POA: Diagnosis not present

## 2021-03-17 DIAGNOSIS — E7800 Pure hypercholesterolemia, unspecified: Secondary | ICD-10-CM | POA: Diagnosis not present

## 2021-03-17 DIAGNOSIS — I2510 Atherosclerotic heart disease of native coronary artery without angina pectoris: Secondary | ICD-10-CM | POA: Diagnosis not present

## 2021-03-17 DIAGNOSIS — I129 Hypertensive chronic kidney disease with stage 1 through stage 4 chronic kidney disease, or unspecified chronic kidney disease: Secondary | ICD-10-CM | POA: Diagnosis not present

## 2021-03-17 DIAGNOSIS — J449 Chronic obstructive pulmonary disease, unspecified: Secondary | ICD-10-CM | POA: Diagnosis not present

## 2021-03-17 DIAGNOSIS — E1122 Type 2 diabetes mellitus with diabetic chronic kidney disease: Secondary | ICD-10-CM | POA: Diagnosis not present

## 2021-03-17 DIAGNOSIS — K7460 Unspecified cirrhosis of liver: Secondary | ICD-10-CM | POA: Diagnosis not present

## 2021-03-17 LAB — BASIC METABOLIC PANEL
BUN/Creatinine Ratio: 14 (ref 10–24)
BUN: 33 mg/dL — ABNORMAL HIGH (ref 8–27)
CO2: 27 mmol/L (ref 20–29)
Calcium: 9.6 mg/dL (ref 8.6–10.2)
Chloride: 97 mmol/L (ref 96–106)
Creatinine, Ser: 2.37 mg/dL — ABNORMAL HIGH (ref 0.76–1.27)
Glucose: 118 mg/dL — ABNORMAL HIGH (ref 70–99)
Potassium: 4.4 mmol/L (ref 3.5–5.2)
Sodium: 137 mmol/L (ref 134–144)
eGFR: 27 mL/min/{1.73_m2} — ABNORMAL LOW (ref >59)

## 2021-03-17 LAB — TSH+T4F+T3FREE
Free T4: 1.81 ng/dL — ABNORMAL HIGH (ref 0.82–1.77)
T3, Free: 1.7 pg/mL — ABNORMAL LOW (ref 2.0–4.4)
TSH: 4.18 u[IU]/mL (ref 0.450–4.500)

## 2021-03-17 LAB — PRO B NATRIURETIC PEPTIDE: NT-Pro BNP: 962 pg/mL — ABNORMAL HIGH (ref 0–486)

## 2021-03-17 NOTE — Telephone Encounter (Signed)
Spoke with patient regarding results and recommendation.  Patient verbalizes understanding and is agreeable to plan of care. Advised patient to call back with any issues or concerns.  

## 2021-03-17 NOTE — Telephone Encounter (Signed)
-----   Message from Richardo Priest, MD sent at 03/17/2021  1:53 PM EST ----- Good results stable CKD potassium is normal no changes in his office visit 2 days ago

## 2021-03-21 DIAGNOSIS — I130 Hypertensive heart and chronic kidney disease with heart failure and stage 1 through stage 4 chronic kidney disease, or unspecified chronic kidney disease: Secondary | ICD-10-CM | POA: Diagnosis not present

## 2021-03-21 DIAGNOSIS — R600 Localized edema: Secondary | ICD-10-CM | POA: Diagnosis not present

## 2021-03-21 DIAGNOSIS — I2510 Atherosclerotic heart disease of native coronary artery without angina pectoris: Secondary | ICD-10-CM | POA: Diagnosis not present

## 2021-03-21 DIAGNOSIS — E782 Mixed hyperlipidemia: Secondary | ICD-10-CM | POA: Diagnosis not present

## 2021-03-21 DIAGNOSIS — E1142 Type 2 diabetes mellitus with diabetic polyneuropathy: Secondary | ICD-10-CM | POA: Diagnosis not present

## 2021-03-21 DIAGNOSIS — E1122 Type 2 diabetes mellitus with diabetic chronic kidney disease: Secondary | ICD-10-CM | POA: Diagnosis not present

## 2021-03-21 DIAGNOSIS — G629 Polyneuropathy, unspecified: Secondary | ICD-10-CM | POA: Diagnosis not present

## 2021-03-21 DIAGNOSIS — I5032 Chronic diastolic (congestive) heart failure: Secondary | ICD-10-CM | POA: Diagnosis not present

## 2021-03-21 DIAGNOSIS — N184 Chronic kidney disease, stage 4 (severe): Secondary | ICD-10-CM | POA: Diagnosis not present

## 2021-03-22 DIAGNOSIS — N1832 Chronic kidney disease, stage 3b: Secondary | ICD-10-CM | POA: Diagnosis not present

## 2021-03-24 DIAGNOSIS — E7800 Pure hypercholesterolemia, unspecified: Secondary | ICD-10-CM | POA: Diagnosis not present

## 2021-03-24 DIAGNOSIS — D631 Anemia in chronic kidney disease: Secondary | ICD-10-CM | POA: Diagnosis not present

## 2021-03-24 DIAGNOSIS — N179 Acute kidney failure, unspecified: Secondary | ICD-10-CM | POA: Diagnosis not present

## 2021-03-24 DIAGNOSIS — E1122 Type 2 diabetes mellitus with diabetic chronic kidney disease: Secondary | ICD-10-CM | POA: Diagnosis not present

## 2021-03-24 DIAGNOSIS — K7460 Unspecified cirrhosis of liver: Secondary | ICD-10-CM | POA: Diagnosis not present

## 2021-03-24 DIAGNOSIS — N183 Chronic kidney disease, stage 3 unspecified: Secondary | ICD-10-CM | POA: Diagnosis not present

## 2021-03-24 DIAGNOSIS — J449 Chronic obstructive pulmonary disease, unspecified: Secondary | ICD-10-CM | POA: Diagnosis not present

## 2021-03-24 DIAGNOSIS — I2510 Atherosclerotic heart disease of native coronary artery without angina pectoris: Secondary | ICD-10-CM | POA: Diagnosis not present

## 2021-03-24 DIAGNOSIS — I129 Hypertensive chronic kidney disease with stage 1 through stage 4 chronic kidney disease, or unspecified chronic kidney disease: Secondary | ICD-10-CM | POA: Diagnosis not present

## 2021-03-25 DIAGNOSIS — D631 Anemia in chronic kidney disease: Secondary | ICD-10-CM | POA: Diagnosis not present

## 2021-03-25 DIAGNOSIS — E1122 Type 2 diabetes mellitus with diabetic chronic kidney disease: Secondary | ICD-10-CM | POA: Diagnosis not present

## 2021-03-25 DIAGNOSIS — E7800 Pure hypercholesterolemia, unspecified: Secondary | ICD-10-CM | POA: Diagnosis not present

## 2021-03-25 DIAGNOSIS — J449 Chronic obstructive pulmonary disease, unspecified: Secondary | ICD-10-CM | POA: Diagnosis not present

## 2021-03-25 DIAGNOSIS — N183 Chronic kidney disease, stage 3 unspecified: Secondary | ICD-10-CM | POA: Diagnosis not present

## 2021-03-25 DIAGNOSIS — I129 Hypertensive chronic kidney disease with stage 1 through stage 4 chronic kidney disease, or unspecified chronic kidney disease: Secondary | ICD-10-CM | POA: Diagnosis not present

## 2021-03-25 DIAGNOSIS — N179 Acute kidney failure, unspecified: Secondary | ICD-10-CM | POA: Diagnosis not present

## 2021-03-25 DIAGNOSIS — K7460 Unspecified cirrhosis of liver: Secondary | ICD-10-CM | POA: Diagnosis not present

## 2021-03-25 DIAGNOSIS — I2510 Atherosclerotic heart disease of native coronary artery without angina pectoris: Secondary | ICD-10-CM | POA: Diagnosis not present

## 2021-03-28 DIAGNOSIS — E7800 Pure hypercholesterolemia, unspecified: Secondary | ICD-10-CM | POA: Diagnosis not present

## 2021-03-28 DIAGNOSIS — I129 Hypertensive chronic kidney disease with stage 1 through stage 4 chronic kidney disease, or unspecified chronic kidney disease: Secondary | ICD-10-CM | POA: Diagnosis not present

## 2021-03-28 DIAGNOSIS — E1122 Type 2 diabetes mellitus with diabetic chronic kidney disease: Secondary | ICD-10-CM | POA: Diagnosis not present

## 2021-03-28 DIAGNOSIS — K7460 Unspecified cirrhosis of liver: Secondary | ICD-10-CM | POA: Diagnosis not present

## 2021-03-28 DIAGNOSIS — N183 Chronic kidney disease, stage 3 unspecified: Secondary | ICD-10-CM | POA: Diagnosis not present

## 2021-03-28 DIAGNOSIS — J449 Chronic obstructive pulmonary disease, unspecified: Secondary | ICD-10-CM | POA: Diagnosis not present

## 2021-03-28 DIAGNOSIS — I2510 Atherosclerotic heart disease of native coronary artery without angina pectoris: Secondary | ICD-10-CM | POA: Diagnosis not present

## 2021-03-28 DIAGNOSIS — N179 Acute kidney failure, unspecified: Secondary | ICD-10-CM | POA: Diagnosis not present

## 2021-03-28 DIAGNOSIS — D631 Anemia in chronic kidney disease: Secondary | ICD-10-CM | POA: Diagnosis not present

## 2021-03-29 DIAGNOSIS — I129 Hypertensive chronic kidney disease with stage 1 through stage 4 chronic kidney disease, or unspecified chronic kidney disease: Secondary | ICD-10-CM | POA: Diagnosis not present

## 2021-03-29 DIAGNOSIS — J449 Chronic obstructive pulmonary disease, unspecified: Secondary | ICD-10-CM | POA: Diagnosis not present

## 2021-03-29 DIAGNOSIS — N179 Acute kidney failure, unspecified: Secondary | ICD-10-CM | POA: Diagnosis not present

## 2021-03-29 DIAGNOSIS — E1122 Type 2 diabetes mellitus with diabetic chronic kidney disease: Secondary | ICD-10-CM | POA: Diagnosis not present

## 2021-03-29 DIAGNOSIS — I2510 Atherosclerotic heart disease of native coronary artery without angina pectoris: Secondary | ICD-10-CM | POA: Diagnosis not present

## 2021-03-29 DIAGNOSIS — D631 Anemia in chronic kidney disease: Secondary | ICD-10-CM | POA: Diagnosis not present

## 2021-03-29 DIAGNOSIS — K7460 Unspecified cirrhosis of liver: Secondary | ICD-10-CM | POA: Diagnosis not present

## 2021-03-29 DIAGNOSIS — N183 Chronic kidney disease, stage 3 unspecified: Secondary | ICD-10-CM | POA: Diagnosis not present

## 2021-03-29 DIAGNOSIS — E7800 Pure hypercholesterolemia, unspecified: Secondary | ICD-10-CM | POA: Diagnosis not present

## 2021-04-04 DIAGNOSIS — E1122 Type 2 diabetes mellitus with diabetic chronic kidney disease: Secondary | ICD-10-CM | POA: Diagnosis not present

## 2021-04-04 DIAGNOSIS — J449 Chronic obstructive pulmonary disease, unspecified: Secondary | ICD-10-CM | POA: Diagnosis not present

## 2021-04-04 DIAGNOSIS — N179 Acute kidney failure, unspecified: Secondary | ICD-10-CM | POA: Diagnosis not present

## 2021-04-04 DIAGNOSIS — K7460 Unspecified cirrhosis of liver: Secondary | ICD-10-CM | POA: Diagnosis not present

## 2021-04-04 DIAGNOSIS — I2510 Atherosclerotic heart disease of native coronary artery without angina pectoris: Secondary | ICD-10-CM | POA: Diagnosis not present

## 2021-04-04 DIAGNOSIS — I129 Hypertensive chronic kidney disease with stage 1 through stage 4 chronic kidney disease, or unspecified chronic kidney disease: Secondary | ICD-10-CM | POA: Diagnosis not present

## 2021-04-04 DIAGNOSIS — D631 Anemia in chronic kidney disease: Secondary | ICD-10-CM | POA: Diagnosis not present

## 2021-04-04 DIAGNOSIS — N183 Chronic kidney disease, stage 3 unspecified: Secondary | ICD-10-CM | POA: Diagnosis not present

## 2021-04-04 DIAGNOSIS — E7800 Pure hypercholesterolemia, unspecified: Secondary | ICD-10-CM | POA: Diagnosis not present

## 2021-04-06 ENCOUNTER — Ambulatory Visit: Payer: Medicare HMO | Admitting: Sports Medicine

## 2021-04-06 ENCOUNTER — Encounter: Payer: Self-pay | Admitting: Sports Medicine

## 2021-04-06 DIAGNOSIS — I739 Peripheral vascular disease, unspecified: Secondary | ICD-10-CM

## 2021-04-06 DIAGNOSIS — N183 Chronic kidney disease, stage 3 unspecified: Secondary | ICD-10-CM | POA: Diagnosis not present

## 2021-04-06 DIAGNOSIS — M79675 Pain in left toe(s): Secondary | ICD-10-CM

## 2021-04-06 DIAGNOSIS — Z794 Long term (current) use of insulin: Secondary | ICD-10-CM

## 2021-04-06 DIAGNOSIS — K7460 Unspecified cirrhosis of liver: Secondary | ICD-10-CM | POA: Diagnosis not present

## 2021-04-06 DIAGNOSIS — E119 Type 2 diabetes mellitus without complications: Secondary | ICD-10-CM

## 2021-04-06 DIAGNOSIS — E1122 Type 2 diabetes mellitus with diabetic chronic kidney disease: Secondary | ICD-10-CM | POA: Diagnosis not present

## 2021-04-06 DIAGNOSIS — E7800 Pure hypercholesterolemia, unspecified: Secondary | ICD-10-CM | POA: Diagnosis not present

## 2021-04-06 DIAGNOSIS — I2510 Atherosclerotic heart disease of native coronary artery without angina pectoris: Secondary | ICD-10-CM | POA: Diagnosis not present

## 2021-04-06 DIAGNOSIS — D631 Anemia in chronic kidney disease: Secondary | ICD-10-CM | POA: Diagnosis not present

## 2021-04-06 DIAGNOSIS — I129 Hypertensive chronic kidney disease with stage 1 through stage 4 chronic kidney disease, or unspecified chronic kidney disease: Secondary | ICD-10-CM | POA: Diagnosis not present

## 2021-04-06 DIAGNOSIS — N179 Acute kidney failure, unspecified: Secondary | ICD-10-CM | POA: Diagnosis not present

## 2021-04-06 DIAGNOSIS — B351 Tinea unguium: Secondary | ICD-10-CM | POA: Diagnosis not present

## 2021-04-06 DIAGNOSIS — M79674 Pain in right toe(s): Secondary | ICD-10-CM | POA: Diagnosis not present

## 2021-04-06 DIAGNOSIS — J449 Chronic obstructive pulmonary disease, unspecified: Secondary | ICD-10-CM | POA: Diagnosis not present

## 2021-04-06 NOTE — Progress Notes (Signed)
Subjective: Oscar Kim is a 80 y.o. male patient with history of diabetes presents to office for diabetic nail care. No other issues noted.  Fasting blood sugar 58 A1c 7  PCP [place] x couple of 2 months ago   Patient Active Problem List   Diagnosis Date Noted   CAD (coronary artery disease)    Chronic systolic heart failure (HCC)    Hyperlipidemia    Obesity (BMI 30-39.9)    Type 2 diabetes mellitus ([place])    Other general symptoms and signs  09/19/2019   Vitamin D deficiency, unspecified  09/19/2019   NSVT (nonsustained ventricular tachycardia) 08/22/2019   Chronic diastolic heart failure ([place]) 08/22/2019   Type 2 diabetes mellitus without complication, with long-term current use of insulin ([place]) 07/31/2019   Frequent PVCs 20/25/4270   Chronic systolic CHF (congestive heart failure) ([place]) 07/23/2019   Dyspnea 07/23/2019   Fatigue 07/23/2019   Coronary artery disease involving native coronary artery of native heart without angina pectoris 07/23/2019   PAD (peripheral artery disease) ([place]) 07/23/2019   Mixed hyperlipidemia 07/23/2019   Essential hypertension 07/23/2019   Current Outpatient Medications on File Prior to Visit  Medication Sig Dispense Refill   albuterol (VENTOLIN HFA) 108 (90 Base) MCG/ACT inhaler Inhale 1 puff into the lungs daily as needed.     allopurinol (ZYLOPRIM) 100 MG tablet Take 100 mg by mouth daily.     amiodarone (PACERONE) 200 MG tablet Take 1 tablet (200 mg total) by mouth daily. 90 tablet 3   aspirin EC 81 MG tablet Take 81 mg by mouth daily.     carvedilol (COREG) 3.125 MG tablet Take 3.125 mg by mouth daily.     clopidogrel (PLAVIX) 75 MG tablet Take 75 mg by mouth daily.     escitalopram (LEXAPRO) 5 MG tablet Take 5 mg by mouth daily.     ferrous sulfate 325 (65 FE) MG tablet Take 325 mg by mouth daily with breakfast.     fluticasone (VERAMYST) 27.5 MCG/SPRAY nasal spray Place 1 spray into the nose daily.     LANTUS SOLOSTAR 100 UNIT/ML Solostar  Pen Inject 10-20 Units into the skin daily. Sliding scale     meclizine (ANTIVERT) 12.5 MG tablet Take 12.5 mg by mouth 3 (three) times daily as needed.     methocarbamol (ROBAXIN) 500 MG tablet Take 500 mg by mouth 2 (two) times daily.     metolazone (ZAROXOLYN) 5 MG tablet Take 5 mg by mouth daily as needed. Take if your weight is above 209 lbs or greater.      omeprazole (PRILOSEC) 40 MG capsule Take 40 mg by mouth daily.     torsemide (DEMADEX) 20 MG tablet Take 20 mg by mouth daily.     traZODone (DESYREL) 50 MG tablet Take 50 mg by mouth daily.     No current facility-administered medications on file prior to visit.   No Known Allergies  Recent Results (from the past 2160 hour(s))  Basic metabolic panel     Status: Abnormal   Collection Time: 03/15/21 10:34 AM  Result Value Ref Range   Glucose 118 (H) 70 - 99 mg/dL   BUN 33 (H) 8 - 27 mg/dL   Creatinine, Ser 2.37 (H) 0.76 - 1.27 mg/dL   eGFR 27 (L) >59 mL/min/1.73   BUN/Creatinine Ratio 14 10 - 24   Sodium 137 134 - 144 mmol/L   Potassium 4.4 3.5 - 5.2 mmol/L   Chloride 97 96 - 106 mmol/L  CO2 27 20 - 29 mmol/L   Calcium 9.6 8.6 - 10.2 mg/dL  Pro b natriuretic peptide (BNP)     Status: Abnormal   Collection Time: 03/15/21 10:34 AM  Result Value Ref Range   NT-Pro BNP 962 (H) 0 - 486 pg/mL    Comment: The following cut-points have been suggested for the use of proBNP for the diagnostic evaluation of heart failure (HF) in patients with acute dyspnea: Modality                     Age           Optimal Cut                            (years)            Point ------------------------------------------------------ Diagnosis (rule in HF)        <50            450 pg/mL                           50 - 75            900 pg/mL                               >75           1800 pg/mL Exclusion (rule out HF)  Age independent     300 pg/mL   TSH+T4F+T3Free     Status: Abnormal   Collection Time: 03/15/21 10:34 AM  Result Value Ref  Range   TSH 4.180 0.450 - 4.500 uIU/mL   T3, Free 1.7 (L) 2.0 - 4.4 pg/mL   Free T4 1.81 (H) 0.82 - 1.77 ng/dL    Objective: General: Patient is awake, alert, and oriented x 3 and in no acute distress.  Integument: Skin is warm, dry and supple bilateral. Nails are tender, long, thickened and dystrophic with subungual debris, consistent with onychomycosis, 1-5 bilateral. Scabs/healed abrasions to multiple toes bilateral.  Vasculature:  Dorsalis Pedis pulse 1/4 bilateral. Posterior Tibial pulse  0/4 bilateral. Capillary fill time <5 sec 1-5 bilateral. No hair growth to the level of the digits.Temperature gradient within normal limits.  Trophic skin changes present bilateral. 1+ pitting edema present bilateral ankles.   Neurology: The patient has absent sensation measured with a 5.07/10g Semmes Weinstein Monofilament at all pedal sites bilateral . Vibratory sensation absent bilateral with tuning fork. No Babinski sign present bilateral.   Musculoskeletal: Asymptomatic pes planus pedal and minimal hammertoe deformities noted bilateral. Muscular strength 5/5 in all lower extremity muscular groups bilateral without pain on range of motion. No tenderness with calf compression bilateral.  Assessment and Plan: Problem List Items Addressed This Visit       Endocrine   Type 2 diabetes mellitus without complication, with long-term current use of insulin ([place])   Other Visit Diagnoses     Pain due to onychomycosis of toenails of both feet    -  Primary   PVD (peripheral vascular disease) ([place])           -Examined patient. -Re-Discussed and educated patient on diabetic foot care, especially with  regards to the vascular, neurological and musculoskeletal systems.  -Mechanically debrided all nails 1-5 bilateral using sterile nail nipper and filed with dremel without incident  -Advised patient  to monitor abrasions/scabs to toes and let come off on on -Advised patient to elevate feet to help with  swelling at ankles -Advised patient to eat or drink something for low blood sugar; patient has a diet coke -Answered all patient questions -Patient to return  in 3 months for at risk foot care  -Patient advised to call the office if any problems or questions arise in the meantime.  Landis Martins, DPM

## 2021-04-10 ENCOUNTER — Other Ambulatory Visit: Payer: Self-pay | Admitting: Family

## 2021-04-26 ENCOUNTER — Ambulatory Visit: Payer: Medicare HMO | Admitting: Cardiology

## 2021-04-28 ENCOUNTER — Other Ambulatory Visit: Payer: Self-pay | Admitting: Family

## 2021-05-03 DIAGNOSIS — N1832 Chronic kidney disease, stage 3b: Secondary | ICD-10-CM | POA: Diagnosis not present

## 2021-05-06 DIAGNOSIS — E1122 Type 2 diabetes mellitus with diabetic chronic kidney disease: Secondary | ICD-10-CM | POA: Diagnosis not present

## 2021-05-06 DIAGNOSIS — I5032 Chronic diastolic (congestive) heart failure: Secondary | ICD-10-CM | POA: Diagnosis not present

## 2021-05-06 DIAGNOSIS — J449 Chronic obstructive pulmonary disease, unspecified: Secondary | ICD-10-CM | POA: Diagnosis not present

## 2021-05-10 ENCOUNTER — Ambulatory Visit: Payer: Medicare HMO | Admitting: Sports Medicine

## 2021-05-12 DIAGNOSIS — I2510 Atherosclerotic heart disease of native coronary artery without angina pectoris: Secondary | ICD-10-CM | POA: Diagnosis not present

## 2021-05-12 DIAGNOSIS — N1832 Chronic kidney disease, stage 3b: Secondary | ICD-10-CM | POA: Diagnosis not present

## 2021-05-12 DIAGNOSIS — I509 Heart failure, unspecified: Secondary | ICD-10-CM | POA: Diagnosis not present

## 2021-05-12 DIAGNOSIS — I129 Hypertensive chronic kidney disease with stage 1 through stage 4 chronic kidney disease, or unspecified chronic kidney disease: Secondary | ICD-10-CM | POA: Diagnosis not present

## 2021-05-18 ENCOUNTER — Other Ambulatory Visit: Payer: Self-pay | Admitting: Family

## 2021-05-25 DIAGNOSIS — I739 Peripheral vascular disease, unspecified: Secondary | ICD-10-CM | POA: Diagnosis not present

## 2021-05-25 DIAGNOSIS — I130 Hypertensive heart and chronic kidney disease with heart failure and stage 1 through stage 4 chronic kidney disease, or unspecified chronic kidney disease: Secondary | ICD-10-CM | POA: Diagnosis not present

## 2021-05-25 DIAGNOSIS — D638 Anemia in other chronic diseases classified elsewhere: Secondary | ICD-10-CM | POA: Diagnosis not present

## 2021-05-25 DIAGNOSIS — E1122 Type 2 diabetes mellitus with diabetic chronic kidney disease: Secondary | ICD-10-CM | POA: Diagnosis not present

## 2021-05-25 DIAGNOSIS — E1142 Type 2 diabetes mellitus with diabetic polyneuropathy: Secondary | ICD-10-CM | POA: Diagnosis not present

## 2021-05-25 DIAGNOSIS — N184 Chronic kidney disease, stage 4 (severe): Secondary | ICD-10-CM | POA: Diagnosis not present

## 2021-05-25 DIAGNOSIS — I5032 Chronic diastolic (congestive) heart failure: Secondary | ICD-10-CM | POA: Diagnosis not present

## 2021-05-25 DIAGNOSIS — E782 Mixed hyperlipidemia: Secondary | ICD-10-CM | POA: Diagnosis not present

## 2021-05-25 DIAGNOSIS — I2510 Atherosclerotic heart disease of native coronary artery without angina pectoris: Secondary | ICD-10-CM | POA: Diagnosis not present

## 2021-05-25 DIAGNOSIS — G629 Polyneuropathy, unspecified: Secondary | ICD-10-CM | POA: Diagnosis not present

## 2021-05-25 DIAGNOSIS — M109 Gout, unspecified: Secondary | ICD-10-CM | POA: Diagnosis not present

## 2021-06-07 DIAGNOSIS — I5032 Chronic diastolic (congestive) heart failure: Secondary | ICD-10-CM | POA: Diagnosis not present

## 2021-06-07 DIAGNOSIS — E1122 Type 2 diabetes mellitus with diabetic chronic kidney disease: Secondary | ICD-10-CM | POA: Diagnosis not present

## 2021-06-07 DIAGNOSIS — J449 Chronic obstructive pulmonary disease, unspecified: Secondary | ICD-10-CM | POA: Diagnosis not present

## 2021-06-13 DIAGNOSIS — Z6834 Body mass index (BMI) 34.0-34.9, adult: Secondary | ICD-10-CM | POA: Diagnosis not present

## 2021-06-13 DIAGNOSIS — R600 Localized edema: Secondary | ICD-10-CM | POA: Diagnosis not present

## 2021-06-13 DIAGNOSIS — G4700 Insomnia, unspecified: Secondary | ICD-10-CM | POA: Diagnosis not present

## 2021-06-13 DIAGNOSIS — G2581 Restless legs syndrome: Secondary | ICD-10-CM | POA: Diagnosis not present

## 2021-06-13 DIAGNOSIS — L03115 Cellulitis of right lower limb: Secondary | ICD-10-CM | POA: Diagnosis not present

## 2021-06-15 ENCOUNTER — Other Ambulatory Visit: Payer: Self-pay

## 2021-06-15 ENCOUNTER — Ambulatory Visit: Payer: Medicare HMO | Admitting: Cardiology

## 2021-06-15 ENCOUNTER — Encounter: Payer: Self-pay | Admitting: Cardiology

## 2021-06-15 VITALS — BP 100/52 | HR 64 | Ht 66.0 in | Wt 207.8 lb

## 2021-06-15 DIAGNOSIS — Z79899 Other long term (current) drug therapy: Secondary | ICD-10-CM | POA: Diagnosis not present

## 2021-06-15 DIAGNOSIS — I110 Hypertensive heart disease with heart failure: Secondary | ICD-10-CM

## 2021-06-15 DIAGNOSIS — I493 Ventricular premature depolarization: Secondary | ICD-10-CM

## 2021-06-15 DIAGNOSIS — I4729 Other ventricular tachycardia: Secondary | ICD-10-CM

## 2021-06-15 DIAGNOSIS — E782 Mixed hyperlipidemia: Secondary | ICD-10-CM

## 2021-06-15 DIAGNOSIS — R0602 Shortness of breath: Secondary | ICD-10-CM | POA: Diagnosis not present

## 2021-06-15 DIAGNOSIS — I2510 Atherosclerotic heart disease of native coronary artery without angina pectoris: Secondary | ICD-10-CM

## 2021-06-15 MED ORDER — TORSEMIDE 20 MG PO TABS: 20.0000 mg | ORAL_TABLET | Freq: Every day | ORAL | 3 refills | Status: DC

## 2021-06-15 NOTE — Progress Notes (Signed)
Cardiology Office Note:    Date:  06/15/2021   ID:  Oscar Kim, DOB [birth year], MRN [id number]  PCP:  Pcp, No  Cardiologist:  Shirlee More, MD    Referring MD: [name], NP    ASSESSMENT:    1. Frequent PVCs   2. NSVT (nonsustained ventricular tachycardia)   3. On amiodarone therapy   4. Hypertensive heart disease with heart failure ([place])   5. Coronary artery disease involving native coronary artery of native heart without angina pectoris   6. Mixed hyperlipidemia   7. SOB (shortness of breath)    PLAN:    In order of problems listed above:  Continue low-dose amiodarone tolerated well without liver or thyroid toxicity and no indication for ICD Heart failure is decompensated fluid overloaded take his diuretic daily we will recheck his renal function 2 weeks Stable CAD no anginal discomfort continue medical therapy including long-term dual antiplatelet his K PN shows he is taking a statin we will be sure that he does Continue iron reduced to 1 tablet 3 days a week to avoid iron blockade   Next appointment: 3 months   Medication Adjustments/Labs and Tests Ordered: Current medicines are reviewed at length with the patient today.  Concerns regarding medicines are outlined above.  Orders Placed This Encounter  Procedures   Basic metabolic panel   Pro b natriuretic peptide (BNP)   EKG 12-Lead   Meds ordered this encounter  Medications   torsemide (DEMADEX) 20 MG tablet    Sig: Take 1 tablet (20 mg total) by mouth daily.    Dispense:  90 tablet    Refill:  3    Chief Complaint  Patient presents with   Follow-up    On amiodarone    History of Present Illness:    Oscar Kim is a 81 y.o. male with a hx of CAD with remote PCI and stent 15 years ago peripheral arterial disease with percutaneous revascularization heart failure diastolic EF 60 to 76% ventricular ectopy with nonsustained VT on amiodarone hypertensive heart disease hyperlipidemia diabetes and obesity   last seen 03/15/2021.  Compliance with diet, lifestyle and medications: Yes  His son is present actively involved in his father's care he continues to have lower extremity edema and has fluid oozing from ulceration right lower extremity.  He gives variable responses about diuretic nephrologist has not taken his family doctor increases to 5 days a week tell him to take his torsemide daily to track his blood pressure for the next 2 weeks and they will bring a list to my office we will recheck his BMP at that time He tolerates amiodarone no palpitation or syncope He is having no angina shortness of breath. He is on no lipid-lowering therapy. 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 approximately 15 years ago - [place]   Chronic diastolic heart failure ([place]) 7/34/1937   Chronic systolic CHF (congestive heart failure) ([place]) 01/08/4096   Chronic systolic heart failure (HCC)    Coronary artery disease involving native coronary artery of native heart without angina pectoris 07/23/2019   Dyspnea 07/23/2019   Essential hypertension    Fatigue 07/23/2019   Frequent PVCs 07/23/2019   Hyperlipidemia    Mixed hyperlipidemia 07/23/2019   NSVT (nonsustained ventricular tachycardia) 08/22/2019   Obesity (BMI 30-39.9)  Other general symptoms and signs  09/19/2019   PAD (peripheral artery disease) (HCC)    Previous stent revascularization, left leg - [place]   Type 2 diabetes mellitus ([place])    Type 2 diabetes mellitus without complication, with long-term current use of insulin ([place]) 07/31/2019   Vitamin D deficiency, unspecified  09/19/2019    Past Surgical History:  Procedure Laterality Date   CORONARY ANGIOPLASTY WITH STENT PLACEMENT     When he lived in [place], multiple stents  placed   KNEE SURGERY Right     Current Medications: Current Meds  Medication Sig   albuterol (VENTOLIN HFA) 108 (90 Base) MCG/ACT inhaler Inhale 1 puff into the lungs daily as needed for shortness of breath or wheezing.   allopurinol (ZYLOPRIM) 100 MG tablet Take 100 mg by mouth daily.   amiodarone (PACERONE) 200 MG tablet Take 1 tablet (200 mg total) by mouth daily.   aspirin EC 81 MG tablet Take 81 mg by mouth daily.   carvedilol (COREG) 3.125 MG tablet Take 3.125 mg by mouth daily.   clopidogrel (PLAVIX) 75 MG tablet Take 75 mg by mouth daily.   escitalopram (LEXAPRO) 5 MG tablet Take 5 mg by mouth daily.   ferrous sulfate 325 (65 FE) MG tablet Take 325 mg by mouth daily with breakfast.   fluticasone (VERAMYST) 27.5 MCG/SPRAY nasal spray Place 1 spray into the nose daily.   LANTUS SOLOSTAR 100 UNIT/ML Solostar Pen Inject 10-20 Units into the skin daily. Sliding scale   meclizine (ANTIVERT) 12.5 MG tablet Take 12.5 mg by mouth 3 (three) times daily as needed for dizziness or nausea.   methocarbamol (ROBAXIN) 500 MG tablet Take 500 mg by mouth 2 (two) times daily.   metolazone (ZAROXOLYN) 5 MG tablet Take 5 mg by mouth daily as needed (If weight is over 209 or >). Take if your weight is above 209 lbs or greater.   omeprazole (PRILOSEC) 40 MG capsule Take 40 mg by mouth daily.   torsemide (DEMADEX) 20 MG tablet Take 1 tablet (20 mg total) by mouth daily.   traZODone (DESYREL) 50 MG tablet Take 50 mg by mouth daily.   [DISCONTINUED] torsemide (DEMADEX) 20 MG tablet Take 20 mg by mouth daily.     Allergies:   Patient has no known allergies.   Social History   Socioeconomic History   Marital status: Married    Spouse name: Not on file   Number of children: Not on file   Years of education: Not on file   Highest education level: Not on file  Occupational History   Not on file  Tobacco Use   Smoking status: Former    Types: Cigarettes    Quit date: 07/23/1979    Years since  quitting: 41.9   Smokeless tobacco: Current    Types: Snuff  Vaping Use   Vaping Use: Never used  Substance and Sexual Activity   Alcohol use: Never   Drug use: Never   Sexual activity: Not on file  Other Topics Concern   Not on file  Social History Narrative   Not on file   Social Determinants of Health   Financial Resource Strain: Not on file  Food Insecurity: Not on file  Transportation Needs: Not on file  Physical Activity: Not on file  Stress: Not on file  Social Connections: Not on file     Family History: The patient's family history includes Hypertension in his father. ROS:   Please see the history  of present illness.    All other systems reviewed and are negative.  EKGs/Labs/Other Studies Reviewed:    The following studies were reviewed today:  EKG:  EKG ordered today and personally reviewed.  The ekg ordered today demonstrates sinus rhythm 59 bpm QT interval mildly increased 489 ms no ischemic or infarction changes  Recent Labs: 03/15/2021: BUN 33; Creatinine, Ser 2.37; NT-Pro BNP 962; Potassium 4.4; Sodium 137; TSH 4.180  Recent Lipid Panel    Component Value Date/Time   CHOL 237 (H) 07/31/2019 0256   TRIG 192 (H) 07/31/2019 0256   HDL 31 (L) 07/31/2019 0256   CHOLHDL 7.6 07/31/2019 0256   VLDL 38 07/31/2019 0256   LDLCALC 168 (H) 07/31/2019 0256    Physical Exam:    VS:  BP (!) 100/52 (BP Location: Right Arm, Patient Position: Sitting)    Pulse 64    Ht 5\' 6"  (1.676 m)    Wt 207 lb 12.8 oz (94.3 kg)    SpO2 98%    BMI 33.54 kg/m     Wt Readings from Last 3 Encounters:  06/15/21 207 lb 12.8 oz (94.3 kg)  03/15/21 201 lb 12.8 oz (91.5 kg)  09/15/20 213 lb 3.2 oz (96.7 kg)     GEN:  Well nourished, well developed in no acute distress HEENT: Normal NECK: No JVD; No carotid bruits LYMPHATICS: No lymphadenopathy CARDIAC: RRR, no murmurs, rubs, gallops RESPIRATORY:  Clear to auscultation without rales, wheezing or rhonchi  ABDOMEN: Soft,  non-tender, non-distended MUSCULOSKELETAL: 1-2+ bilateral lower extremity pitting edema; No deformity  SKIN: Warm and dry NEUROLOGIC:  Alert and oriented x 3 PSYCHIATRIC:  Normal affect    Signed, Shirlee More, MD  06/15/2021 9:23 AM    [place]

## 2021-06-15 NOTE — Patient Instructions (Signed)
Medication Instructions:  Your physician has recommended you make the following change in your medication:  START: Torsemide 20 mg take one tablet by mouth daily.  *If you need a refill on your cardiac medications before your next appointment, please call your pharmacy*   Lab Work: Your physician recommends that you return for lab work in: 2 weeks and please bring a list of your blood pressure readings.  BMP, ProBNP If you have labs (blood work) drawn today and your tests are completely normal, you will receive your results only by: [place] (if you have MyChart) OR A paper copy in the mail If you have any lab test that is abnormal or we need to change your treatment, we will call you to review the results.   Testing/Procedures: None   Follow-Up: At [facility], you and your health needs are our priority.  As part of our continuing mission to provide you with exceptional heart care, we have created designated Provider Care Teams.  These Care Teams include your primary Cardiologist (physician) and Advanced Practice Providers (APPs -  Physician Assistants and Nurse Practitioners) who all work together to provide you with the care you need, when you need it.  We recommend signing up for the patient portal called "MyChart".  Sign up information is provided on this After Visit Summary.  MyChart is used to connect with patients for Virtual Visits (Telemedicine).  Patients are able to view lab/test results, encounter notes, upcoming appointments, etc.  Non-urgent messages can be sent to your provider as well.   To learn more about what you can do with MyChart, go to [email or web address].    Your next appointment:   3 month(s)  The format for your next appointment:   In Person  Provider:   Shirlee More, MD    Other Instructions

## 2021-06-29 ENCOUNTER — Other Ambulatory Visit: Payer: Self-pay

## 2021-06-29 DIAGNOSIS — E782 Mixed hyperlipidemia: Secondary | ICD-10-CM

## 2021-06-29 DIAGNOSIS — Z79899 Other long term (current) drug therapy: Secondary | ICD-10-CM

## 2021-06-29 DIAGNOSIS — I493 Ventricular premature depolarization: Secondary | ICD-10-CM | POA: Diagnosis not present

## 2021-06-29 DIAGNOSIS — I2510 Atherosclerotic heart disease of native coronary artery without angina pectoris: Secondary | ICD-10-CM

## 2021-06-29 DIAGNOSIS — R0602 Shortness of breath: Secondary | ICD-10-CM | POA: Diagnosis not present

## 2021-06-29 DIAGNOSIS — I110 Hypertensive heart disease with heart failure: Secondary | ICD-10-CM | POA: Diagnosis not present

## 2021-06-29 DIAGNOSIS — I4729 Other ventricular tachycardia: Secondary | ICD-10-CM

## 2021-06-30 LAB — BASIC METABOLIC PANEL
BUN/Creatinine Ratio: 16 (ref 10–24)
BUN: 27 mg/dL (ref 8–27)
CO2: 23 mmol/L (ref 20–29)
Calcium: 8.6 mg/dL (ref 8.6–10.2)
Chloride: 103 mmol/L (ref 96–106)
Creatinine, Ser: 1.74 mg/dL — ABNORMAL HIGH (ref 0.76–1.27)
Glucose: 322 mg/dL — ABNORMAL HIGH (ref 70–99)
Potassium: 4.5 mmol/L (ref 3.5–5.2)
Sodium: 137 mmol/L (ref 134–144)
eGFR: 40 mL/min/{1.73_m2} — ABNORMAL LOW (ref >59)

## 2021-06-30 LAB — PRO B NATRIURETIC PEPTIDE: NT-Pro BNP: 1550 pg/mL — ABNORMAL HIGH (ref 0–486)

## 2021-07-05 ENCOUNTER — Other Ambulatory Visit: Payer: Self-pay

## 2021-07-05 ENCOUNTER — Ambulatory Visit: Payer: Medicare HMO | Admitting: Sports Medicine

## 2021-07-05 ENCOUNTER — Encounter: Payer: Self-pay | Admitting: Sports Medicine

## 2021-07-05 DIAGNOSIS — B351 Tinea unguium: Secondary | ICD-10-CM | POA: Diagnosis not present

## 2021-07-05 DIAGNOSIS — M79675 Pain in left toe(s): Secondary | ICD-10-CM

## 2021-07-05 DIAGNOSIS — Z794 Long term (current) use of insulin: Secondary | ICD-10-CM

## 2021-07-05 DIAGNOSIS — E119 Type 2 diabetes mellitus without complications: Secondary | ICD-10-CM

## 2021-07-05 DIAGNOSIS — I739 Peripheral vascular disease, unspecified: Secondary | ICD-10-CM

## 2021-07-05 DIAGNOSIS — M79674 Pain in right toe(s): Secondary | ICD-10-CM

## 2021-07-05 NOTE — Progress Notes (Signed)
Subjective: Oscar Kim is a 81 y.o. male patient with history of diabetes presents to office for diabetic nail care. No other issues noted.  Fasting blood sugar 66 A1c 6 PCP Dr. Wynetta Emery in [place] 1 month ago   Patient Active Problem List   Diagnosis Date Noted   CAD (coronary artery disease)    Chronic systolic heart failure (HCC)    Hyperlipidemia    Obesity (BMI 30-39.9)    Type 2 diabetes mellitus ([place])    Other general symptoms and signs  09/19/2019   Vitamin D deficiency, unspecified  09/19/2019   NSVT (nonsustained ventricular tachycardia) 08/22/2019   Chronic diastolic heart failure ([place]) 08/22/2019   Type 2 diabetes mellitus without complication, with long-term current use of insulin ([place]) 07/31/2019   Frequent PVCs 02/21/5101   Chronic systolic CHF (congestive heart failure) ([place]) 07/23/2019   Dyspnea 07/23/2019   Fatigue 07/23/2019   Coronary artery disease involving native coronary artery of native heart without angina pectoris 07/23/2019   PAD (peripheral artery disease) ([place]) 07/23/2019   Mixed hyperlipidemia 07/23/2019   Essential hypertension 07/23/2019   Current Outpatient Medications on File Prior to Visit  Medication Sig Dispense Refill   albuterol (VENTOLIN HFA) 108 (90 Base) MCG/ACT inhaler Inhale 1 puff into the lungs daily as needed for shortness of breath or wheezing.     allopurinol (ZYLOPRIM) 100 MG tablet Take 100 mg by mouth daily.     amiodarone (PACERONE) 200 MG tablet Take 1 tablet (200 mg total) by mouth daily. 90 tablet 3   aspirin EC 81 MG tablet Take 81 mg by mouth daily.     carvedilol (COREG) 3.125 MG tablet Take 3.125 mg by mouth daily.     clopidogrel (PLAVIX) 75 MG tablet Take 75 mg by mouth daily.     escitalopram (LEXAPRO) 5 MG tablet Take 5 mg by mouth daily.     ferrous sulfate 325 (65 FE) MG tablet Take 325 mg by mouth daily with breakfast.     fluticasone (VERAMYST) 27.5 MCG/SPRAY nasal spray Place 1 spray into the nose daily.      LANTUS SOLOSTAR 100 UNIT/ML Solostar Pen Inject 10-20 Units into the skin daily. Sliding scale     meclizine (ANTIVERT) 12.5 MG tablet Take 12.5 mg by mouth 3 (three) times daily as needed for dizziness or nausea.     methocarbamol (ROBAXIN) 500 MG tablet Take 500 mg by mouth 2 (two) times daily.     metolazone (ZAROXOLYN) 5 MG tablet Take 5 mg by mouth daily as needed (If weight is over 209 or >). Take if your weight is above 209 lbs or greater.     omeprazole (PRILOSEC) 40 MG capsule Take 40 mg by mouth daily.     torsemide (DEMADEX) 20 MG tablet Take 1 tablet (20 mg total) by mouth daily. 90 tablet 3   traZODone (DESYREL) 50 MG tablet Take 50 mg by mouth daily.     No current facility-administered medications on file prior to visit.   No Known Allergies  Recent Results (from the past 2160 hour(s))  Basic metabolic panel     Status: Abnormal   Collection Time: 06/29/21 11:06 AM  Result Value Ref Range   Glucose 322 (H) 70 - 99 mg/dL   BUN 27 8 - 27 mg/dL   Creatinine, Ser 1.74 (H) 0.76 - 1.27 mg/dL   eGFR 40 (L) >59 mL/min/1.73   BUN/Creatinine Ratio 16 10 - 24   Sodium 137 134 - 144  mmol/L   Potassium 4.5 3.5 - 5.2 mmol/L   Chloride 103 96 - 106 mmol/L   CO2 23 20 - 29 mmol/L   Calcium 8.6 8.6 - 10.2 mg/dL  Pro b natriuretic peptide (BNP)     Status: Abnormal   Collection Time: 06/29/21 11:06 AM  Result Value Ref Range   NT-Pro BNP 1,550 (H) 0 - 486 pg/mL    Comment: The following cut-points have been suggested for the use of proBNP for the diagnostic evaluation of heart failure (HF) in patients with acute dyspnea: Modality                     Age           Optimal Cut                            (years)            Point ------------------------------------------------------ Diagnosis (rule in HF)        <50            450 pg/mL                           50 - 75            900 pg/mL                               >75           1800 pg/mL Exclusion (rule out HF)  Age  independent     300 pg/mL     Objective: General: Patient is awake, alert, and oriented x 3 and in no acute distress.  Integument: Skin is warm, dry and supple bilateral. Nails are tender, long, thickened and dystrophic with subungual debris, consistent with onychomycosis, 1-5 bilateral.  Minimal scab at the left fifth toe likely from Patient from bumping the toe.  No pain and nail well attached.  Vasculature:  Dorsalis Pedis pulse 1/4 bilateral. Posterior Tibial pulse  0/4 bilateral. Capillary fill time <5 sec 1-5 bilateral. No hair growth to the level of the digits.Temperature gradient within normal limits.  Trophic skin changes present bilateral. 1+ pitting edema present bilateral ankles.   Neurology: Unchanged from prior.  Musculoskeletal: Asymptomatic pes planus pedal and minimal hammertoe deformities noted bilateral. Muscular strength 5/5 in all lower extremity muscular groups bilateral without pain on range of motion. No tenderness with calf compression bilateral.  Assessment and Plan: Problem List Items Addressed This Visit       Endocrine   Type 2 diabetes mellitus without complication, with long-term current use of insulin ([place])   Other Visit Diagnoses     Pain due to onychomycosis of toenails of both feet    -  Primary   PVD (peripheral vascular disease) ([place])           -Examined patient. -Discussed with patient importance of daily foot inspection in the setting of diabetes -Mechanically debrided all nails 1-5 bilateral using sterile nail nipper and filed with dremel without incident  -Advised patient to monitor abrasions/scabs to toe left fifth toe and elevate or use compression garments for edema control -Dispensed sample of foot miracle cream to use as needed for dry skin -Return to office in 3 months for at risk foot care or sooner if problems or issues arise -  Patient advised to call the office if any problems or questions arise in the meantime.  Landis Martins,  DPM

## 2021-07-18 DIAGNOSIS — R7989 Other specified abnormal findings of blood chemistry: Secondary | ICD-10-CM | POA: Diagnosis not present

## 2021-07-18 DIAGNOSIS — R946 Abnormal results of thyroid function studies: Secondary | ICD-10-CM | POA: Diagnosis not present

## 2021-07-26 ENCOUNTER — Other Ambulatory Visit: Payer: Self-pay

## 2021-07-26 MED ORDER — AMIODARONE HCL 200 MG PO TABS: 200.0000 mg | ORAL_TABLET | Freq: Every day | ORAL | 2 refills | Status: DC

## 2021-07-28 DIAGNOSIS — I872 Venous insufficiency (chronic) (peripheral): Secondary | ICD-10-CM | POA: Diagnosis not present

## 2021-07-28 DIAGNOSIS — E1142 Type 2 diabetes mellitus with diabetic polyneuropathy: Secondary | ICD-10-CM | POA: Diagnosis not present

## 2021-07-28 DIAGNOSIS — Z6835 Body mass index (BMI) 35.0-35.9, adult: Secondary | ICD-10-CM | POA: Diagnosis not present

## 2021-07-28 DIAGNOSIS — R600 Localized edema: Secondary | ICD-10-CM | POA: Diagnosis not present

## 2021-07-28 DIAGNOSIS — G4700 Insomnia, unspecified: Secondary | ICD-10-CM | POA: Diagnosis not present

## 2021-07-29 ENCOUNTER — Other Ambulatory Visit: Payer: Self-pay

## 2021-07-29 DIAGNOSIS — M7989 Other specified soft tissue disorders: Secondary | ICD-10-CM | POA: Diagnosis not present

## 2021-07-29 DIAGNOSIS — R600 Localized edema: Secondary | ICD-10-CM | POA: Diagnosis not present

## 2021-07-29 DIAGNOSIS — M79661 Pain in right lower leg: Secondary | ICD-10-CM | POA: Diagnosis not present

## 2021-07-29 MED ORDER — AMIODARONE HCL 200 MG PO TABS: 200.0000 mg | ORAL_TABLET | Freq: Every day | ORAL | 3 refills | Status: DC

## 2021-08-03 ENCOUNTER — Ambulatory Visit: Payer: Medicare HMO | Admitting: Orthopaedic Surgery

## 2021-08-18 ENCOUNTER — Telehealth: Payer: Self-pay | Admitting: Cardiology

## 2021-08-18 NOTE — Telephone Encounter (Signed)
Pt c/o swelling: STAT is pt has developed SOB within 24 hours ? ?How much weight have you gained and in what time span?  ?3 lbs in 1 day ? ?If swelling, where is the swelling located?  ?Abdomen  ? ?Are you currently taking a fluid pill?  ?Per Elmyra Ricks, patient takes Torsemide 20 MG tablet once daily ? ?Are you currently SOB?  ? ?Do you have a log of your daily weights (if so, list)?  ?No  ? ?Have you gained 3 pounds in a day or 5 pounds in a week?  ?Elmyra Ricks reports patient gained 3 lbs over night  ? ?Have you traveled recently?  ?No   ? ?

## 2021-08-18 NOTE — Telephone Encounter (Signed)
Called patient and reviewed Dr. Joya Gaskins recommendation to take an extra torsemide every other day. Patient agreed with this plan and had no further questions at this time. ?

## 2021-08-18 NOTE — Telephone Encounter (Signed)
Called Oscar Kim the [facility] Nurse back and she reported that the patient had a 3 lb. weight gain overnight in his abdomen and his abdomen feels full. He also has lower extremity edema. He is not currently short of breath, but does become short of breath with exertion. The nurse could not remember the patients blood pressure or heart rate. He is on Torsemide 20 mg daily and he has taken it today.Will forward this to Dr. Bettina Gavia for advice. ?  ?

## 2021-08-29 ENCOUNTER — Telehealth: Payer: Self-pay | Admitting: Cardiology

## 2021-08-29 DIAGNOSIS — I130 Hypertensive heart and chronic kidney disease with heart failure and stage 1 through stage 4 chronic kidney disease, or unspecified chronic kidney disease: Secondary | ICD-10-CM | POA: Diagnosis not present

## 2021-08-29 DIAGNOSIS — Z8673 Personal history of transient ischemic attack (TIA), and cerebral infarction without residual deficits: Secondary | ICD-10-CM | POA: Diagnosis not present

## 2021-08-29 DIAGNOSIS — I509 Heart failure, unspecified: Secondary | ICD-10-CM | POA: Diagnosis not present

## 2021-08-29 DIAGNOSIS — I5033 Acute on chronic diastolic (congestive) heart failure: Secondary | ICD-10-CM | POA: Diagnosis not present

## 2021-08-29 DIAGNOSIS — N183 Chronic kidney disease, stage 3 unspecified: Secondary | ICD-10-CM | POA: Diagnosis not present

## 2021-08-29 DIAGNOSIS — E1165 Type 2 diabetes mellitus with hyperglycemia: Secondary | ICD-10-CM | POA: Diagnosis not present

## 2021-08-29 DIAGNOSIS — R531 Weakness: Secondary | ICD-10-CM | POA: Diagnosis not present

## 2021-08-29 DIAGNOSIS — I10 Essential (primary) hypertension: Secondary | ICD-10-CM | POA: Diagnosis not present

## 2021-08-29 DIAGNOSIS — E1122 Type 2 diabetes mellitus with diabetic chronic kidney disease: Secondary | ICD-10-CM | POA: Diagnosis not present

## 2021-08-29 DIAGNOSIS — I2510 Atherosclerotic heart disease of native coronary artery without angina pectoris: Secondary | ICD-10-CM | POA: Diagnosis not present

## 2021-08-29 DIAGNOSIS — R0602 Shortness of breath: Secondary | ICD-10-CM | POA: Diagnosis not present

## 2021-08-29 DIAGNOSIS — R188 Other ascites: Secondary | ICD-10-CM | POA: Diagnosis not present

## 2021-08-29 DIAGNOSIS — E119 Type 2 diabetes mellitus without complications: Secondary | ICD-10-CM | POA: Diagnosis not present

## 2021-08-29 DIAGNOSIS — J449 Chronic obstructive pulmonary disease, unspecified: Secondary | ICD-10-CM | POA: Diagnosis not present

## 2021-08-29 DIAGNOSIS — I517 Cardiomegaly: Secondary | ICD-10-CM | POA: Diagnosis not present

## 2021-08-29 DIAGNOSIS — K7460 Unspecified cirrhosis of liver: Secondary | ICD-10-CM | POA: Diagnosis not present

## 2021-08-29 DIAGNOSIS — I110 Hypertensive heart disease with heart failure: Secondary | ICD-10-CM | POA: Diagnosis not present

## 2021-08-29 DIAGNOSIS — E875 Hyperkalemia: Secondary | ICD-10-CM | POA: Diagnosis not present

## 2021-08-29 NOTE — Telephone Encounter (Signed)
?  Pt c/o swelling: STAT is pt has developed SOB within 24 hours ? ?If swelling, where is the swelling located? Abdomen and legs, Elmyra Ricks, RN states abdomen is tight and distended ? ?How much weight have you gained and in what time span? 5 pds, 227lbs to 232 lbs ? ?Have you gained 3 pounds in a day or 5 pounds in a week? Yes, within the last few days ? ?Do you have a log of your daily weights (if so, list)? yes ? ?Are you currently taking a fluid pill? yes ? ?Are you currently SOB? Yes, with exertion ? ?Have you traveled recently?  No ? ?

## 2021-08-29 NOTE — Telephone Encounter (Signed)
Called patient to get more information. Patient has swelling in his abdomen and legs  and he states that his abdomen is tight and distended. He has gained 5 pounds, with a starting weight of 227 pounds and is now 232 pounds, in the last few days. Patient states he is currently taking a fluid pill and he gets short of breath with exertion. I reviewed these symptoms with Dr. Bettina Gavia and he recommended that he go to the ER. I informed the patient of Dr. Joya Gaskins recommendation that he go to the ER and he stated that he would go as soon as his son got back from his doctors appointment. Patient had no further questions at this time. ?

## 2021-08-30 DIAGNOSIS — E1122 Type 2 diabetes mellitus with diabetic chronic kidney disease: Secondary | ICD-10-CM | POA: Diagnosis not present

## 2021-08-30 DIAGNOSIS — E875 Hyperkalemia: Secondary | ICD-10-CM | POA: Diagnosis not present

## 2021-08-30 DIAGNOSIS — I10 Essential (primary) hypertension: Secondary | ICD-10-CM | POA: Diagnosis not present

## 2021-08-30 DIAGNOSIS — I509 Heart failure, unspecified: Secondary | ICD-10-CM | POA: Diagnosis not present

## 2021-08-31 DIAGNOSIS — E1122 Type 2 diabetes mellitus with diabetic chronic kidney disease: Secondary | ICD-10-CM | POA: Diagnosis not present

## 2021-08-31 DIAGNOSIS — I509 Heart failure, unspecified: Secondary | ICD-10-CM | POA: Diagnosis not present

## 2021-08-31 DIAGNOSIS — E875 Hyperkalemia: Secondary | ICD-10-CM | POA: Diagnosis not present

## 2021-08-31 DIAGNOSIS — I10 Essential (primary) hypertension: Secondary | ICD-10-CM | POA: Diagnosis not present

## 2021-09-01 DIAGNOSIS — E1122 Type 2 diabetes mellitus with diabetic chronic kidney disease: Secondary | ICD-10-CM | POA: Diagnosis not present

## 2021-09-01 DIAGNOSIS — I10 Essential (primary) hypertension: Secondary | ICD-10-CM | POA: Diagnosis not present

## 2021-09-01 DIAGNOSIS — I509 Heart failure, unspecified: Secondary | ICD-10-CM | POA: Diagnosis not present

## 2021-09-01 DIAGNOSIS — E875 Hyperkalemia: Secondary | ICD-10-CM | POA: Diagnosis not present

## 2021-09-04 DIAGNOSIS — I130 Hypertensive heart and chronic kidney disease with heart failure and stage 1 through stage 4 chronic kidney disease, or unspecified chronic kidney disease: Secondary | ICD-10-CM | POA: Diagnosis not present

## 2021-09-04 DIAGNOSIS — E1142 Type 2 diabetes mellitus with diabetic polyneuropathy: Secondary | ICD-10-CM | POA: Diagnosis not present

## 2021-09-07 DIAGNOSIS — N184 Chronic kidney disease, stage 4 (severe): Secondary | ICD-10-CM | POA: Diagnosis not present

## 2021-09-07 DIAGNOSIS — R188 Other ascites: Secondary | ICD-10-CM | POA: Diagnosis not present

## 2021-09-07 DIAGNOSIS — I5032 Chronic diastolic (congestive) heart failure: Secondary | ICD-10-CM | POA: Diagnosis not present

## 2021-09-07 DIAGNOSIS — Z6837 Body mass index (BMI) 37.0-37.9, adult: Secondary | ICD-10-CM | POA: Diagnosis not present

## 2021-09-07 DIAGNOSIS — I2510 Atherosclerotic heart disease of native coronary artery without angina pectoris: Secondary | ICD-10-CM | POA: Diagnosis not present

## 2021-09-07 DIAGNOSIS — K7460 Unspecified cirrhosis of liver: Secondary | ICD-10-CM | POA: Diagnosis not present

## 2021-09-07 DIAGNOSIS — Z79899 Other long term (current) drug therapy: Secondary | ICD-10-CM | POA: Diagnosis not present

## 2021-09-09 ENCOUNTER — Telehealth: Payer: Self-pay | Admitting: Cardiology

## 2021-09-09 DIAGNOSIS — D649 Anemia, unspecified: Secondary | ICD-10-CM | POA: Diagnosis not present

## 2021-09-09 DIAGNOSIS — I2510 Atherosclerotic heart disease of native coronary artery without angina pectoris: Secondary | ICD-10-CM | POA: Diagnosis not present

## 2021-09-09 DIAGNOSIS — E1165 Type 2 diabetes mellitus with hyperglycemia: Secondary | ICD-10-CM | POA: Diagnosis not present

## 2021-09-09 DIAGNOSIS — K7581 Nonalcoholic steatohepatitis (NASH): Secondary | ICD-10-CM | POA: Diagnosis not present

## 2021-09-09 DIAGNOSIS — N183 Chronic kidney disease, stage 3 unspecified: Secondary | ICD-10-CM | POA: Diagnosis not present

## 2021-09-09 DIAGNOSIS — I130 Hypertensive heart and chronic kidney disease with heart failure and stage 1 through stage 4 chronic kidney disease, or unspecified chronic kidney disease: Secondary | ICD-10-CM | POA: Diagnosis not present

## 2021-09-09 DIAGNOSIS — I361 Nonrheumatic tricuspid (valve) insufficiency: Secondary | ICD-10-CM | POA: Diagnosis not present

## 2021-09-09 DIAGNOSIS — I5033 Acute on chronic diastolic (congestive) heart failure: Secondary | ICD-10-CM | POA: Diagnosis not present

## 2021-09-09 DIAGNOSIS — I10 Essential (primary) hypertension: Secondary | ICD-10-CM | POA: Diagnosis not present

## 2021-09-09 DIAGNOSIS — M7989 Other specified soft tissue disorders: Secondary | ICD-10-CM | POA: Diagnosis not present

## 2021-09-09 DIAGNOSIS — N184 Chronic kidney disease, stage 4 (severe): Secondary | ICD-10-CM | POA: Diagnosis not present

## 2021-09-09 DIAGNOSIS — R0602 Shortness of breath: Secondary | ICD-10-CM | POA: Diagnosis not present

## 2021-09-09 DIAGNOSIS — I340 Nonrheumatic mitral (valve) insufficiency: Secondary | ICD-10-CM | POA: Diagnosis not present

## 2021-09-09 DIAGNOSIS — D61818 Other pancytopenia: Secondary | ICD-10-CM | POA: Diagnosis not present

## 2021-09-09 DIAGNOSIS — I509 Heart failure, unspecified: Secondary | ICD-10-CM | POA: Diagnosis not present

## 2021-09-09 DIAGNOSIS — K7460 Unspecified cirrhosis of liver: Secondary | ICD-10-CM | POA: Diagnosis not present

## 2021-09-09 DIAGNOSIS — E1122 Type 2 diabetes mellitus with diabetic chronic kidney disease: Secondary | ICD-10-CM | POA: Diagnosis not present

## 2021-09-09 NOTE — Telephone Encounter (Signed)
Pt c/o Shortness Of Breath: STAT if SOB developed within the last 24 hours or pt is noticeably SOB on the phone ? ?1. Are you currently SOB (can you hear that pt is SOB on the phone)? No ? ?2. How long have you been experiencing SOB?  ?Last Thursday ? ?3. Are you SOB when sitting or when up moving around? Moving around and when he talks ? ?4. Are you currently experiencing any other symptoms? Swelling in stomach. Pt's son states that pt is gasping for air when he tries to talk ? ?

## 2021-09-09 NOTE — Telephone Encounter (Signed)
Patient informed of Dr. Joya Gaskins recommendation that he go back to the ER to have more fluid drained off. I explained that this may help his shortness of breath and that Dr. Bettina Gavia did not feel this was something that could be resolved in an outpatient setting. Patient agreed and said Ok. ?

## 2021-09-10 DIAGNOSIS — I509 Heart failure, unspecified: Secondary | ICD-10-CM

## 2021-09-10 DIAGNOSIS — K7581 Nonalcoholic steatohepatitis (NASH): Secondary | ICD-10-CM

## 2021-09-10 DIAGNOSIS — I10 Essential (primary) hypertension: Secondary | ICD-10-CM

## 2021-09-10 DIAGNOSIS — I5033 Acute on chronic diastolic (congestive) heart failure: Secondary | ICD-10-CM | POA: Diagnosis not present

## 2021-09-10 DIAGNOSIS — D61818 Other pancytopenia: Secondary | ICD-10-CM | POA: Diagnosis not present

## 2021-09-10 DIAGNOSIS — E1122 Type 2 diabetes mellitus with diabetic chronic kidney disease: Secondary | ICD-10-CM

## 2021-09-10 DIAGNOSIS — I130 Hypertensive heart and chronic kidney disease with heart failure and stage 1 through stage 4 chronic kidney disease, or unspecified chronic kidney disease: Secondary | ICD-10-CM | POA: Diagnosis not present

## 2021-09-11 ENCOUNTER — Encounter: Payer: Self-pay | Admitting: Cardiology

## 2021-09-11 DIAGNOSIS — I5033 Acute on chronic diastolic (congestive) heart failure: Secondary | ICD-10-CM | POA: Diagnosis not present

## 2021-09-11 DIAGNOSIS — E1122 Type 2 diabetes mellitus with diabetic chronic kidney disease: Secondary | ICD-10-CM | POA: Diagnosis not present

## 2021-09-11 DIAGNOSIS — D61818 Other pancytopenia: Secondary | ICD-10-CM | POA: Diagnosis not present

## 2021-09-11 DIAGNOSIS — I509 Heart failure, unspecified: Secondary | ICD-10-CM | POA: Diagnosis not present

## 2021-09-11 DIAGNOSIS — M7989 Other specified soft tissue disorders: Secondary | ICD-10-CM | POA: Diagnosis not present

## 2021-09-11 DIAGNOSIS — K7581 Nonalcoholic steatohepatitis (NASH): Secondary | ICD-10-CM | POA: Diagnosis not present

## 2021-09-11 DIAGNOSIS — I10 Essential (primary) hypertension: Secondary | ICD-10-CM | POA: Diagnosis not present

## 2021-09-11 DIAGNOSIS — I130 Hypertensive heart and chronic kidney disease with heart failure and stage 1 through stage 4 chronic kidney disease, or unspecified chronic kidney disease: Secondary | ICD-10-CM | POA: Diagnosis not present

## 2021-09-12 ENCOUNTER — Encounter: Payer: Self-pay | Admitting: Nurse Practitioner

## 2021-09-12 DIAGNOSIS — E1122 Type 2 diabetes mellitus with diabetic chronic kidney disease: Secondary | ICD-10-CM | POA: Diagnosis not present

## 2021-09-12 DIAGNOSIS — I5033 Acute on chronic diastolic (congestive) heart failure: Secondary | ICD-10-CM | POA: Diagnosis not present

## 2021-09-12 DIAGNOSIS — K7581 Nonalcoholic steatohepatitis (NASH): Secondary | ICD-10-CM | POA: Diagnosis not present

## 2021-09-12 DIAGNOSIS — I509 Heart failure, unspecified: Secondary | ICD-10-CM | POA: Diagnosis not present

## 2021-09-12 DIAGNOSIS — D61818 Other pancytopenia: Secondary | ICD-10-CM | POA: Diagnosis not present

## 2021-09-12 DIAGNOSIS — I10 Essential (primary) hypertension: Secondary | ICD-10-CM | POA: Diagnosis not present

## 2021-09-12 DIAGNOSIS — I130 Hypertensive heart and chronic kidney disease with heart failure and stage 1 through stage 4 chronic kidney disease, or unspecified chronic kidney disease: Secondary | ICD-10-CM | POA: Diagnosis not present

## 2021-09-13 DIAGNOSIS — I130 Hypertensive heart and chronic kidney disease with heart failure and stage 1 through stage 4 chronic kidney disease, or unspecified chronic kidney disease: Secondary | ICD-10-CM | POA: Diagnosis not present

## 2021-09-13 DIAGNOSIS — E1122 Type 2 diabetes mellitus with diabetic chronic kidney disease: Secondary | ICD-10-CM

## 2021-09-13 DIAGNOSIS — I5033 Acute on chronic diastolic (congestive) heart failure: Secondary | ICD-10-CM | POA: Diagnosis not present

## 2021-09-13 DIAGNOSIS — I509 Heart failure, unspecified: Secondary | ICD-10-CM

## 2021-09-13 DIAGNOSIS — D61818 Other pancytopenia: Secondary | ICD-10-CM | POA: Diagnosis not present

## 2021-09-13 DIAGNOSIS — K7581 Nonalcoholic steatohepatitis (NASH): Secondary | ICD-10-CM

## 2021-09-14 DIAGNOSIS — K7581 Nonalcoholic steatohepatitis (NASH): Secondary | ICD-10-CM | POA: Diagnosis not present

## 2021-09-14 DIAGNOSIS — I5033 Acute on chronic diastolic (congestive) heart failure: Secondary | ICD-10-CM | POA: Diagnosis not present

## 2021-09-14 DIAGNOSIS — I509 Heart failure, unspecified: Secondary | ICD-10-CM | POA: Diagnosis not present

## 2021-09-14 DIAGNOSIS — I130 Hypertensive heart and chronic kidney disease with heart failure and stage 1 through stage 4 chronic kidney disease, or unspecified chronic kidney disease: Secondary | ICD-10-CM | POA: Diagnosis not present

## 2021-09-14 DIAGNOSIS — D61818 Other pancytopenia: Secondary | ICD-10-CM | POA: Diagnosis not present

## 2021-09-14 DIAGNOSIS — E1122 Type 2 diabetes mellitus with diabetic chronic kidney disease: Secondary | ICD-10-CM | POA: Diagnosis not present

## 2021-09-16 DIAGNOSIS — N1832 Chronic kidney disease, stage 3b: Secondary | ICD-10-CM | POA: Diagnosis not present

## 2021-09-19 ENCOUNTER — Telehealth: Payer: Self-pay | Admitting: Cardiology

## 2021-09-19 MED ORDER — METOLAZONE 5 MG PO TABS: 5.0000 mg | ORAL_TABLET | Freq: Every day | ORAL | 2 refills | Status: DC | PRN

## 2021-09-19 NOTE — Telephone Encounter (Signed)
Gain 6lbs since Friday. Patient doesn't have sob, does have swollen in his leg. ?

## 2021-09-19 NOTE — Telephone Encounter (Signed)
Called patient and informed him that when he picks up the [facility] prescription to go ahead and start taking the medication. Patient stated he understood and had no further questions at this time. ?

## 2021-09-19 NOTE — Telephone Encounter (Signed)
Per Dr. Bettina Gavia: ?I would send a prescription for metolazone is on the list and I may tell him to use it.  ? ?Metolazone 5mg  PRN sent to [facility] pharmacy, patient notified. ?

## 2021-09-19 NOTE — Telephone Encounter (Signed)
Attempted to call Elmyra Ricks from [facility] back, no answer. ? ?Call patient and spoke with him regarding leg swelling and weight gain. ? ?Patient reports weight today is 222 lbs, up from 214 lbs on Friday (09/16/21). Patient denies any SOB, cough or abdominal fullness/distention. Patient reports legs are swollen, states he elevates them when sitting. Patient reports good urine output. ? ?Patient denies increased intake of salt, no missed doses of Lasix or Torsemide. I asked patient about metolazone 5mg  PRN on his medication list, patient states he does not have this medication. ? ?Patient states he has appt with kidney MD tomorrow (09/20/21). ? ?Will forward to Dr. Bettina Gavia to review and advise. ?

## 2021-09-20 DIAGNOSIS — I2510 Atherosclerotic heart disease of native coronary artery without angina pectoris: Secondary | ICD-10-CM | POA: Diagnosis not present

## 2021-09-20 DIAGNOSIS — N1832 Chronic kidney disease, stage 3b: Secondary | ICD-10-CM | POA: Diagnosis not present

## 2021-09-20 DIAGNOSIS — I129 Hypertensive chronic kidney disease with stage 1 through stage 4 chronic kidney disease, or unspecified chronic kidney disease: Secondary | ICD-10-CM | POA: Diagnosis not present

## 2021-09-20 DIAGNOSIS — I509 Heart failure, unspecified: Secondary | ICD-10-CM | POA: Diagnosis not present

## 2021-09-23 ENCOUNTER — Encounter: Payer: Self-pay | Admitting: Cardiology

## 2021-09-23 ENCOUNTER — Ambulatory Visit: Payer: Medicare HMO | Admitting: Cardiology

## 2021-09-23 VITALS — BP 126/58 | HR 64 | Ht 66.0 in | Wt 225.0 lb

## 2021-09-23 DIAGNOSIS — N184 Chronic kidney disease, stage 4 (severe): Secondary | ICD-10-CM | POA: Diagnosis not present

## 2021-09-23 DIAGNOSIS — K7460 Unspecified cirrhosis of liver: Secondary | ICD-10-CM | POA: Diagnosis not present

## 2021-09-23 DIAGNOSIS — R188 Other ascites: Secondary | ICD-10-CM | POA: Diagnosis not present

## 2021-09-23 DIAGNOSIS — I4729 Other ventricular tachycardia: Secondary | ICD-10-CM

## 2021-09-23 DIAGNOSIS — Z79899 Other long term (current) drug therapy: Secondary | ICD-10-CM | POA: Diagnosis not present

## 2021-09-23 DIAGNOSIS — I2510 Atherosclerotic heart disease of native coronary artery without angina pectoris: Secondary | ICD-10-CM

## 2021-09-23 DIAGNOSIS — I110 Hypertensive heart disease with heart failure: Secondary | ICD-10-CM | POA: Diagnosis not present

## 2021-09-23 DIAGNOSIS — I493 Ventricular premature depolarization: Secondary | ICD-10-CM | POA: Diagnosis not present

## 2021-09-23 MED ORDER — TORSEMIDE 20 MG PO TABS: 20.0000 mg | ORAL_TABLET | Freq: Two times a day (BID) | ORAL | 3 refills | Status: AC

## 2021-09-23 MED ORDER — METOLAZONE 5 MG PO TABS: 5.0000 mg | ORAL_TABLET | ORAL | 3 refills | Status: AC

## 2021-09-23 NOTE — Patient Instructions (Signed)
Medication Instructions:  Your physician has recommended you make the following change in your medication:   STOP: Amiodarone STOP: Neurontin START: Torsemide 20 mg twice daily START: Metolazone 5 mg every other day (take 30 minutes before your am dose of torsemide)  *If you need a refill on your cardiac medications before your next appointment, please call your pharmacy*   Lab Work:  Please have your Primary Care Physician draw a BMP  If you have labs (blood work) drawn today and your tests are completely normal, you will receive your results only by: [place] (if you have MyChart) OR A paper copy in the mail If you have any lab test that is abnormal or we need to change your treatment, we will call you to review the results.   Testing/Procedures: None   Follow-Up: At [facility], you and your health needs are our priority.  As part of our continuing mission to provide you with exceptional heart care, we have created designated Provider Care Teams.  These Care Teams include your primary Cardiologist (physician) and Advanced Practice Providers (APPs -  Physician Assistants and Nurse Practitioners) who all work together to provide you with the care you need, when you need it.  We recommend signing up for the patient portal called "MyChart".  Sign up information is provided on this After Visit Summary.  MyChart is used to connect with patients for Virtual Visits (Telemedicine).  Patients are able to view lab/test results, encounter notes, upcoming appointments, etc.  Non-urgent messages can be sent to your provider as well.   To learn more about what you can do with MyChart, go to [email or web address].    Your next appointment:   6 week(s)  The format for your next appointment:   In Person  Provider:   Shirlee More, MD    Other Instructions None  Important Information About Sugar

## 2021-09-23 NOTE — Progress Notes (Signed)
Cardiology Office Note:    Date:  09/23/2021   ID:  Oscar Kim, DOB Sep 11, 1940, MRN [id number]  PCP:  Darrol Jump, PA-C  Cardiologist:  Shirlee More, MD    Referring MD: No ref. provider found    ASSESSMENT:    1. Hypertensive heart disease with heart failure ([place])   2. Coronary artery disease involving native coronary artery of native heart without angina pectoris   3. Frequent PVCs   4. NSVT (nonsustained ventricular tachycardia) ([place])   5. On amiodarone therapy   6. Cirrhosis of liver with ascites, unspecified hepatic cirrhosis type ([place])   7. CKD (chronic kidney disease) stage 4, GFR 15-29 ml/min (HCC)    PLAN:    In order of problems listed above:  His heart failure is decompensated he is fluid overloaded resistant to diuretics and will initiate metolazone.  Also discontinue Neurontin that can cause intense sodium retention.  I am not sure that his predominant problem is heart failure it seems to be more peripheral edema and I suspect that both his liver disease and kidney disease play probable regardless the options for treatment are limited he cannot take an MRA with his CKD and I think with his multiple severe cardiac and renal liver problems and personal wishes palliative care is appropriate consultation placed at the family's request.  They asked that I communicate with his kidney doctor. Stable CAD With liver disease we have to stop amiodarone We will continue to follow with nephrology   Next appointment: 6 to 8 weeks   Medication Adjustments/Labs and Tests Ordered: Current medicines are reviewed at length with the patient today.  Concerns regarding medicines are outlined above.  No orders of the defined types were placed in this encounter.  No orders of the defined types were placed in this encounter.   Chief Complaint  Patient presents with   Follow-up   Congestive Heart Failure    History of Present Illness:    Oscar Kim is a 81 y.o. male with a hx of  CAD with remote PCI and stent 15 years ago peripheral arterial disease with percutaneous revascularization heart failure diastolic EF 60 to 69% ventricular ectopy with nonsustained VT on amiodarone hypertensive heart disease hyperlipidemia diabetes and obesity   last seen 06/15/2021.  Recent CT abdomen 11/27/2021 shows cirrhosis and splenomegaly small volume of ascites anasarca and progressive interstitial lung disease. He also had an echocardiogram 09/10/2021 showing normal ejection fraction 60 to 65% elevated left atrial pressure mild mitral and tricuspid regurgitation mild elevation right ventricular systolic pressure.  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 have put a wrap on his right lower extremity he has a venous ulceration although he is not complaining of shortness of breath chest pain palpitation or syncope The patient and his son both want to do everything they can do the best quality of life and remain outside of the hospital I engaged with discussion of palliative care and they are pleased with that I  will send a referral with is very severe heart disease liver disease and kidney disease I think it is appropriate He is diuretic resistant and ongoing I have initiate metolazone every other day to try to initiated diuresis If ineffective the only other option is IV diuretic in the hospital setting He also has anemia with a hemoglobin of 9.7 that has not been addressed  He was seen by my partner Dr.  Agustin Cree on readmission for volume overload with multiple comorbidities including stage IV CKD GFR 24 cc cirrhosis ascites pancytopenia and anemia hemoglobin 9 platelet count 91,000.  There is a vague statement about amiodarone therapy and perhaps discontinue it because of liver disease but it does not seem as if it was affected during the hospitalization he had a duplex of the lower extremity showing no evidence of deep vein thrombosis.  His EKG in hospital showed sinus bradycardia 55 bpm nonspecific T waves 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 approximately 15 years ago - [place]   Chronic diastolic heart failure ([place]) 7/67/3419   Chronic systolic CHF (congestive heart failure) ([place]) 3/79/0240   Chronic systolic heart failure (HCC)    Coronary artery disease involving native coronary artery of native heart without angina pectoris 07/23/2019   Dyspnea 07/23/2019   Essential hypertension    Fatigue 07/23/2019   Frequent PVCs 07/23/2019   Hyperlipidemia    Mixed hyperlipidemia 07/23/2019   NSVT (nonsustained ventricular tachycardia) 08/22/2019   Obesity (BMI 30-39.9)    Other general symptoms and signs  09/19/2019   PAD (peripheral artery disease) (HCC)    Previous stent revascularization, left leg - [place]   Type 2 diabetes mellitus ([place])    Type 2 diabetes mellitus without complication, with long-term current use of insulin ([place]) 07/31/2019   Vitamin D deficiency, unspecified  09/19/2019    Past Surgical History:  Procedure Laterality Date   CORONARY ANGIOPLASTY WITH STENT PLACEMENT     When he lived in [place], multiple stents placed   KNEE SURGERY Right     Current Medications: No outpatient medications have been marked as taking for the 09/23/21 encounter (Office Visit) with Richardo Priest, MD.     Allergies:   Patient has no known allergies.   Social History   Socioeconomic  History   Marital status: Married    Spouse name: Not on file   Number of children: Not on file   Years of education: Not on file   Highest education level: Not on file  Occupational History   Not on file  Tobacco Use   Smoking status: Former    Types: Cigarettes    Quit date: 07/23/1979    Years since quitting: 42.2   Smokeless tobacco: Current    Types: Snuff  Vaping Use   Vaping Use: Never used  Substance and Sexual Activity   Alcohol use: Never   Drug use: Never   Sexual activity: Not on file  Other Topics Concern   Not on file  Social History Narrative   Not on file   Social Determinants of Health   Financial Resource Strain: Not on file  Food Insecurity: Not on file  Transportation Needs: Not on file  Physical Activity: Not on file  Stress: Not on file  Social Connections: Not on file     Family History: The patient's family history includes Hypertension in his father. ROS:   Please see the  history of present illness.    All other systems reviewed and are negative.  EKGs/Labs/Other Studies Reviewed:    The following studies were reviewed today:  EKG:  EKG ordered today and personally reviewed.  The ekg ordered today demonstrates sinus rhythm first-degree AV block nonspecific conduction delay  Recent Labs: 03/15/2021: TSH 4.180 06/29/2021: BUN 27; Creatinine, Ser 1.74; NT-Pro BNP 1,550; Potassium 4.5; Sodium 137  Recent Lipid Panel    Component Value Date/Time   CHOL 237 (H) 07/31/2019 0256   TRIG 192 (H) 07/31/2019 0256   HDL 31 (L) 07/31/2019 0256   CHOLHDL 7.6 07/31/2019 0256   VLDL 38 07/31/2019 0256   LDLCALC 168 (H) 07/31/2019 0256    Physical Exam:    VS:  There were no vitals taken for this visit.    Wt Readings from Last 3 Encounters:  06/15/21 207 lb 12.8 oz (94.3 kg)  03/15/21 201 lb 12.8 oz (91.5 kg)  09/15/20 213 lb 3.2 oz (96.7 kg)     GEN: He looks very chronically ill he has anasarca well nourished, well developed in no acute  distress HEENT: Normal NECK: No JVD; No carotid bruits LYMPHATICS: No lymphadenopathy CARDIAC: RRR, no murmurs, rubs, gallops RESPIRATORY:  Clear to auscultation without rales, wheezing or rhonchi  ABDOMEN: Soft, non-tender, non-distended MUSCULOSKELETAL: Greater than 4+ bilateral lower extremity pitting edema; No deformity  SKIN: Warm and dry NEUROLOGIC:  Alert and oriented x 3 PSYCHIATRIC:  Normal affect    Signed, Shirlee More, MD  09/23/2021 10:22 AM    [facility]

## 2021-09-26 DIAGNOSIS — D638 Anemia in other chronic diseases classified elsewhere: Secondary | ICD-10-CM | POA: Diagnosis not present

## 2021-09-26 DIAGNOSIS — I739 Peripheral vascular disease, unspecified: Secondary | ICD-10-CM | POA: Diagnosis not present

## 2021-09-26 DIAGNOSIS — K7460 Unspecified cirrhosis of liver: Secondary | ICD-10-CM | POA: Diagnosis not present

## 2021-09-26 DIAGNOSIS — N184 Chronic kidney disease, stage 4 (severe): Secondary | ICD-10-CM | POA: Diagnosis not present

## 2021-09-27 ENCOUNTER — Telehealth: Payer: Self-pay | Admitting: Cardiology

## 2021-09-27 NOTE — Telephone Encounter (Signed)
New Message:    Patient's son  wants to know how is patient supposed to take the Ferrous Sulfate please?

## 2021-09-27 NOTE — Telephone Encounter (Signed)
Advised to check with dose with PCP as they prescribed.

## 2021-09-28 DIAGNOSIS — R0602 Shortness of breath: Secondary | ICD-10-CM | POA: Diagnosis not present

## 2021-09-28 DIAGNOSIS — I130 Hypertensive heart and chronic kidney disease with heart failure and stage 1 through stage 4 chronic kidney disease, or unspecified chronic kidney disease: Secondary | ICD-10-CM | POA: Diagnosis not present

## 2021-09-28 DIAGNOSIS — N184 Chronic kidney disease, stage 4 (severe): Secondary | ICD-10-CM | POA: Diagnosis not present

## 2021-09-28 DIAGNOSIS — I509 Heart failure, unspecified: Secondary | ICD-10-CM | POA: Diagnosis not present

## 2021-09-29 ENCOUNTER — Ambulatory Visit: Payer: Medicare HMO | Admitting: Nurse Practitioner

## 2021-09-29 DIAGNOSIS — K7581 Nonalcoholic steatohepatitis (NASH): Secondary | ICD-10-CM | POA: Diagnosis not present

## 2021-09-29 DIAGNOSIS — I5033 Acute on chronic diastolic (congestive) heart failure: Secondary | ICD-10-CM | POA: Diagnosis not present

## 2021-09-29 DIAGNOSIS — N184 Chronic kidney disease, stage 4 (severe): Secondary | ICD-10-CM | POA: Diagnosis not present

## 2021-09-29 DIAGNOSIS — D631 Anemia in chronic kidney disease: Secondary | ICD-10-CM | POA: Diagnosis not present

## 2021-09-29 DIAGNOSIS — E1122 Type 2 diabetes mellitus with diabetic chronic kidney disease: Secondary | ICD-10-CM | POA: Diagnosis not present

## 2021-09-29 DIAGNOSIS — E785 Hyperlipidemia, unspecified: Secondary | ICD-10-CM | POA: Diagnosis not present

## 2021-09-29 DIAGNOSIS — R0602 Shortness of breath: Secondary | ICD-10-CM | POA: Diagnosis not present

## 2021-09-29 DIAGNOSIS — I509 Heart failure, unspecified: Secondary | ICD-10-CM | POA: Diagnosis not present

## 2021-09-29 DIAGNOSIS — I130 Hypertensive heart and chronic kidney disease with heart failure and stage 1 through stage 4 chronic kidney disease, or unspecified chronic kidney disease: Secondary | ICD-10-CM | POA: Diagnosis not present

## 2021-09-29 DIAGNOSIS — E1165 Type 2 diabetes mellitus with hyperglycemia: Secondary | ICD-10-CM | POA: Diagnosis not present

## 2021-09-29 DIAGNOSIS — J449 Chronic obstructive pulmonary disease, unspecified: Secondary | ICD-10-CM | POA: Diagnosis not present

## 2021-09-29 NOTE — Progress Notes (Unsigned)
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 S/P cholecystectomy 2008.   CT abdomen 11/27/2021 shows cirrhosis and splenomegaly small volume of ascites anasarca and progressive interstitial lung disease. Echo 09/10/2021 showing normal ejection fraction 60 to 65% elevated left atrial pressure mild mitral and tricuspid regurgitation mild elevation right ventricular systolic pressure  Referred to Palliative care   Hypertensive heart disease with heart failure ([place]); Coronary artery disease involving native coronary artery of native heart without angina pectoris; Frequent PVCs; NSVT (nonsustained ventricular tachycardia) ([place]); On amiodarone therapy; Cirrhosis of liver with ascites, unspecified hepatic cirrhosis type (HCC); CKD (chronic kidney disease) stage 4, GFR 15-29 ml/min ([place])  Authorizing provider: Richardo Priest, MD   Per Dr. Bettina Gavia: The patient and his son both want to do everything they can do the best quality of life and remain outside of the hospital I engaged with discussion of palliative care and they are pleased with that I will send a referral with is very severe heart disease liver disease and kidney disease I think it is appropriate He is diuretic resistant and ongoing I have initiate metolazone every other day to try to initiated diuresis    He presents to our house today      Latest Ref Rng & Units 05/20/2020   11:52 AM 04/19/2020   11:47 AM 02/05/2020    9:47 AM  CBC  WBC 3.4 -  10.8 x10E3/uL 8.7   7.5   7.9    Hemoglobin 13.0 - 17.7 g/dL 12.1   10.7   11.7    Hematocrit 37.5 - 51.0 % 36.2   32.6   34.6    Platelets 150 - 450 x10E3/uL 221   290   216         Latest Ref Rng & Units 05/20/2020   11:52 AM 04/19/2020   11:47 AM 02/05/2020    9:47 AM  CBC  WBC 3.4 - 10.8 x10E3/uL 8.7   7.5   7.9    Hemoglobin 13.0 - 17.7 g/dL 12.1   10.7   11.7    Hematocrit 37.5 - 51.0 % 36.2   32.6   34.6    Platelets 150 - 450 x10E3/uL 221   290   216        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 approximately 15 years ago - [place]   Chronic diastolic heart failure ([place]) 0/86/5784   Chronic systolic CHF (congestive heart failure) ([place]) 6/96/2952   Chronic systolic heart failure (HCC)    Coronary artery disease involving native coronary artery of native heart without angina pectoris 07/23/2019   Dyspnea 07/23/2019   Essential hypertension    Fatigue 07/23/2019   Frequent PVCs 07/23/2019   Hyperlipidemia    Mixed hyperlipidemia 07/23/2019   NSVT (nonsustained ventricular tachycardia) ([place]) 08/22/2019   Obesity (BMI 30-39.9)    Other general symptoms and signs  09/19/2019   PAD (peripheral artery disease) (HCC)    Previous  stent revascularization, left leg - [place]   Type 2 diabetes mellitus ([place])    Type 2 diabetes mellitus without complication, with long-term current use of insulin ([place]) 07/31/2019   Vitamin D deficiency, unspecified  09/19/2019   Past Surgical History:  Procedure Laterality Date   CORONARY ANGIOPLASTY WITH STENT PLACEMENT     When he lived in [place], multiple stents placed   KNEE SURGERY Right    Social History:  Family History:    reports that he quit smoking about 42 years ago. His smoking use included cigarettes. He has been exposed to tobacco smoke. His smokeless tobacco use includes snuff. He reports that he does not drink alcohol and does not use  drugs. family history includes Hypertension in his father.  No Known Allergies    Outpatient Encounter Medications as of 09/29/2021  Medication Sig   albuterol (VENTOLIN HFA) 108 (90 Base) MCG/ACT inhaler Inhale 1 puff into the lungs daily as needed for shortness of breath or wheezing.   allopurinol (ZYLOPRIM) 100 MG tablet Take 100 mg by mouth daily.   carvedilol (COREG) 6.25 MG tablet Take 6.25 mg by mouth 2 (two) times daily with a meal.   clopidogrel (PLAVIX) 75 MG tablet Take 75 mg by mouth daily.   escitalopram (LEXAPRO) 5 MG tablet Take 5 mg by mouth daily.   ferrous sulfate 325 (65 FE) MG tablet Take 325 mg by mouth 3 (three) times a week.   fluticasone (FLONASE) 50 MCG/ACT nasal spray 1 spray 2 (two) times daily.   hydrOXYzine (ATARAX) 25 MG tablet Take 25 mg by mouth at bedtime.   LANTUS SOLOSTAR 100 UNIT/ML Solostar Pen Inject 10-20 Units into the skin daily. 10 units am and 15 un pm   metolazone (ZAROXOLYN) 5 MG tablet Take 1 tablet (5 mg total) by mouth every other day. Please take 30 minutes before your am dose of Torsemide.   omeprazole (PRILOSEC) 40 MG capsule Take 40 mg by mouth daily.   pramipexole (MIRAPEX) 0.25 MG tablet Take 0.25 mg by mouth at bedtime.   rosuvastatin (CRESTOR) 20 MG tablet Take 20 mg by mouth at bedtime.   torsemide (DEMADEX) 20 MG tablet Take 1 tablet (20 mg total) by mouth 2 (two) times daily.   TRUE METRIX BLOOD GLUCOSE TEST test strip SMARTSIG:Via Meter   No facility-administered encounter medications on file as of 09/29/2021.     REVIEW OF SYSTEMS: All other systems reviewed and negative except where noted in the History of Present Illness. Gen: Denies fever, sweats or chills. No weight loss.  CV: Denies chest pain, palpitations or edema. Resp: Denies cough, shortness of breath of hemoptysis.  GI: Denies heartburn, dysphagia, stomach or lower abdominal pain. No diarrhea or constipation.  GU : Denies urinary burning, blood in urine, increased  urinary frequency or incontinence. MS: Denies joint pain, muscles aches or weakness. Derm: Denies rash, itchiness, skin lesions or unhealing ulcers. Psych: Denies depression, anxiety, memory loss, suicidal ideation and confusion. Heme: Denies bruising, easy bleeding. Neuro:  Denies headaches, dizziness or paresthesias. Endo:  Denies any problems with DM, thyroid or adrenal function.  PHYSICAL EXAM: There were no vitals taken for this visit. General: Well developed ... in no acute distress. Head: Normocephalic and atraumatic. Eyes:  Sclerae non-icteric, conjunctive pink. Ears: Normal auditory acuity. Mouth: Dentition intact. No ulcers or lesions.  Neck: Supple, no lymphadenopathy or thyromegaly.  Lungs: Clear bilaterally to auscultation without wheezes, crackles or rhonchi. Heart: Regular rate and rhythm. No murmur,  rub or gallop appreciated.  Abdomen: Soft, nontender, non distended. No masses. No hepatosplenomegaly. Normoactive bowel sounds x 4 quadrants.  Rectal:  Musculoskeletal: Symmetrical with no gross deformities. Skin: Warm and dry. No rash or lesions on visible extremities. Extremities: No edema. Neurological: Alert oriented x 4, no focal deficits.  Psychological:  Alert and cooperative. Normal mood and affect.  ASSESSMENT AND PLAN:    CC:  No ref. provider found

## 2021-10-04 ENCOUNTER — Ambulatory Visit: Payer: Medicare HMO | Admitting: Sports Medicine

## 2021-11-05 DEATH — deceased

## 2021-11-12 NOTE — Progress Notes (Deleted)
Cardiology Office Note:    Date:  11/14/2021   ID:  Oscar Kim, DOB 29-Sep-1940, MRN [id number]  PCP:  Darrol Jump, PA-C  Cardiologist:  Shirlee More, MD    Referring MD: Darrol Jump, PA-C    ASSESSMENT:    1. Hypertensive heart disease with heart failure ([place])   2. CKD (chronic kidney disease) stage 4, GFR 15-29 ml/min (HCC)   3. NSVT (nonsustained ventricular tachycardia) ([place])   4. Coronary artery disease involving native coronary artery of native heart without angina pectoris   5. Cirrhosis of liver with ascites, unspecified hepatic cirrhosis type ([place])    PLAN:    In order of problems listed above:  ***   Next appointment: ***   Medication Adjustments/Labs and Tests Ordered: Current medicines are reviewed at length with the patient today.  Concerns regarding medicines are outlined above.  No orders of the defined types were placed in this encounter.  No orders of the defined types were placed in this encounter.   No chief complaint on file.   History of Present Illness:    Oscar Kim is a 81 y.o. male with a hx of CAD with remote PCI and stent 15 years ago peripheral arterial disease with previous revascularization chronic heart failure ventricular ectopy with nonsustained ventricular tachycardia on amiodarone hypertensive heart disease hyperlipidemia and diabetes last seen 09/23/2021.  That was an extremely complicated visit I been unaware that he was in the hospital had a new diagnosis of cirrhosis with ascites and was continued on amiodarone therapy and remained in decompensated heart failure with marked fluid overload and stage IV CKD.  At his request he was referred for palliative care.. Compliance with diet, lifestyle and medications: *** 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 approximately 15 years ago - [place]   Chronic diastolic heart failure ([place]) 08/22/2019    Chronic systolic CHF (congestive heart failure) ([place]) 9/75/8832   Chronic systolic heart failure (HCC)    Coronary artery disease involving native coronary artery of native heart without angina pectoris 07/23/2019   Dyspnea 07/23/2019   Essential hypertension    Fatigue 07/23/2019   Frequent PVCs 07/23/2019   Hyperlipidemia    Mixed hyperlipidemia 07/23/2019   NSVT (nonsustained ventricular tachycardia) ([place]) 08/22/2019   Obesity (BMI 30-39.9)    Other general symptoms and signs  09/19/2019   PAD (peripheral artery disease) (HCC)    Previous stent revascularization, left leg - [place]   Type 2 diabetes mellitus ([place])    Type 2 diabetes mellitus without complication, with long-term current use of insulin ([place]) 07/31/2019   Vitamin D deficiency, unspecified  09/19/2019    Past Surgical History:  Procedure Laterality Date   CORONARY ANGIOPLASTY WITH STENT PLACEMENT     When he lived in [place], multiple stents placed   KNEE SURGERY Right     Current Medications: No outpatient medications have been marked as taking for the 11/14/21 encounter (Appointment) with Richardo Priest, MD.     Allergies:   Patient has no known allergies.   Social History   Socioeconomic History   Marital status: Married    Spouse name: Not on file   Number of children: Not on file   Years of education: Not on file   Highest education level: Not on file  Occupational History   Not on file  Tobacco Use   Smoking status: Former    Types:  Cigarettes    Quit date: 07/23/1979    Years since quitting: 42.3    Passive exposure: Past   Smokeless tobacco: Current    Types: Snuff  Vaping Use   Vaping Use: Never used  Substance and Sexual Activity   Alcohol use: Never   Drug use: Never   Sexual activity: Not on file  Other Topics Concern   Not on file  Social History Narrative   Not on file   Social Determinants of Health   Financial Resource Strain: Not on file  Food Insecurity: Not on file   Transportation Needs: Not on file  Physical Activity: Not on file  Stress: Not on file  Social Connections: Not on file     Family History: The patient's ***family history includes Hypertension in his father. ROS:   Please see the history of present illness.    All other systems reviewed and are negative.  EKGs/Labs/Other Studies Reviewed:    The following studies were reviewed today:  EKG:  EKG ordered today and personally reviewed.  The ekg ordered today demonstrates ***  Recent Labs: 03/15/2021: TSH 4.180 06/29/2021: BUN 27; Creatinine, Ser 1.74; NT-Pro BNP 1,550; Potassium 4.5; Sodium 137  Recent Lipid Panel    Component Value Date/Time   CHOL 237 (H) 07/31/2019 0256   TRIG 192 (H) 07/31/2019 0256   HDL 31 (L) 07/31/2019 0256   CHOLHDL 7.6 07/31/2019 0256   VLDL 38 07/31/2019 0256   LDLCALC 168 (H) 07/31/2019 0256    Physical Exam:    VS:  There were no vitals taken for this visit.    Wt Readings from Last 3 Encounters:  09/23/21 225 lb (102.1 kg)  06/15/21 207 lb 12.8 oz (94.3 kg)  03/15/21 201 lb 12.8 oz (91.5 kg)     GEN: *** Well nourished, well developed in no acute distress HEENT: Normal NECK: No JVD; No carotid bruits LYMPHATICS: No lymphadenopathy CARDIAC: ***RRR, no murmurs, rubs, gallops RESPIRATORY:  Clear to auscultation without rales, wheezing or rhonchi  ABDOMEN: Soft, non-tender, non-distended MUSCULOSKELETAL:  No edema; No deformity  SKIN: Warm and dry NEUROLOGIC:  Alert and oriented x 3 PSYCHIATRIC:  Normal affect    Signed, Shirlee More, MD  11/14/2021 12:26 PM    [place]

## 2021-11-14 ENCOUNTER — Ambulatory Visit: Payer: Medicare HMO | Admitting: Cardiology

## 2021-11-29 IMAGING — CR DG CHEST 2V
2 series · 2 of 2 positions shown · non-contrast
Comparison: 05/31/2004

CLINICAL DATA: Intermittent shortness of breath for several weeks,
remote history of [id number]

EXAM:
CHEST - 2 VIEW

[chest pa]
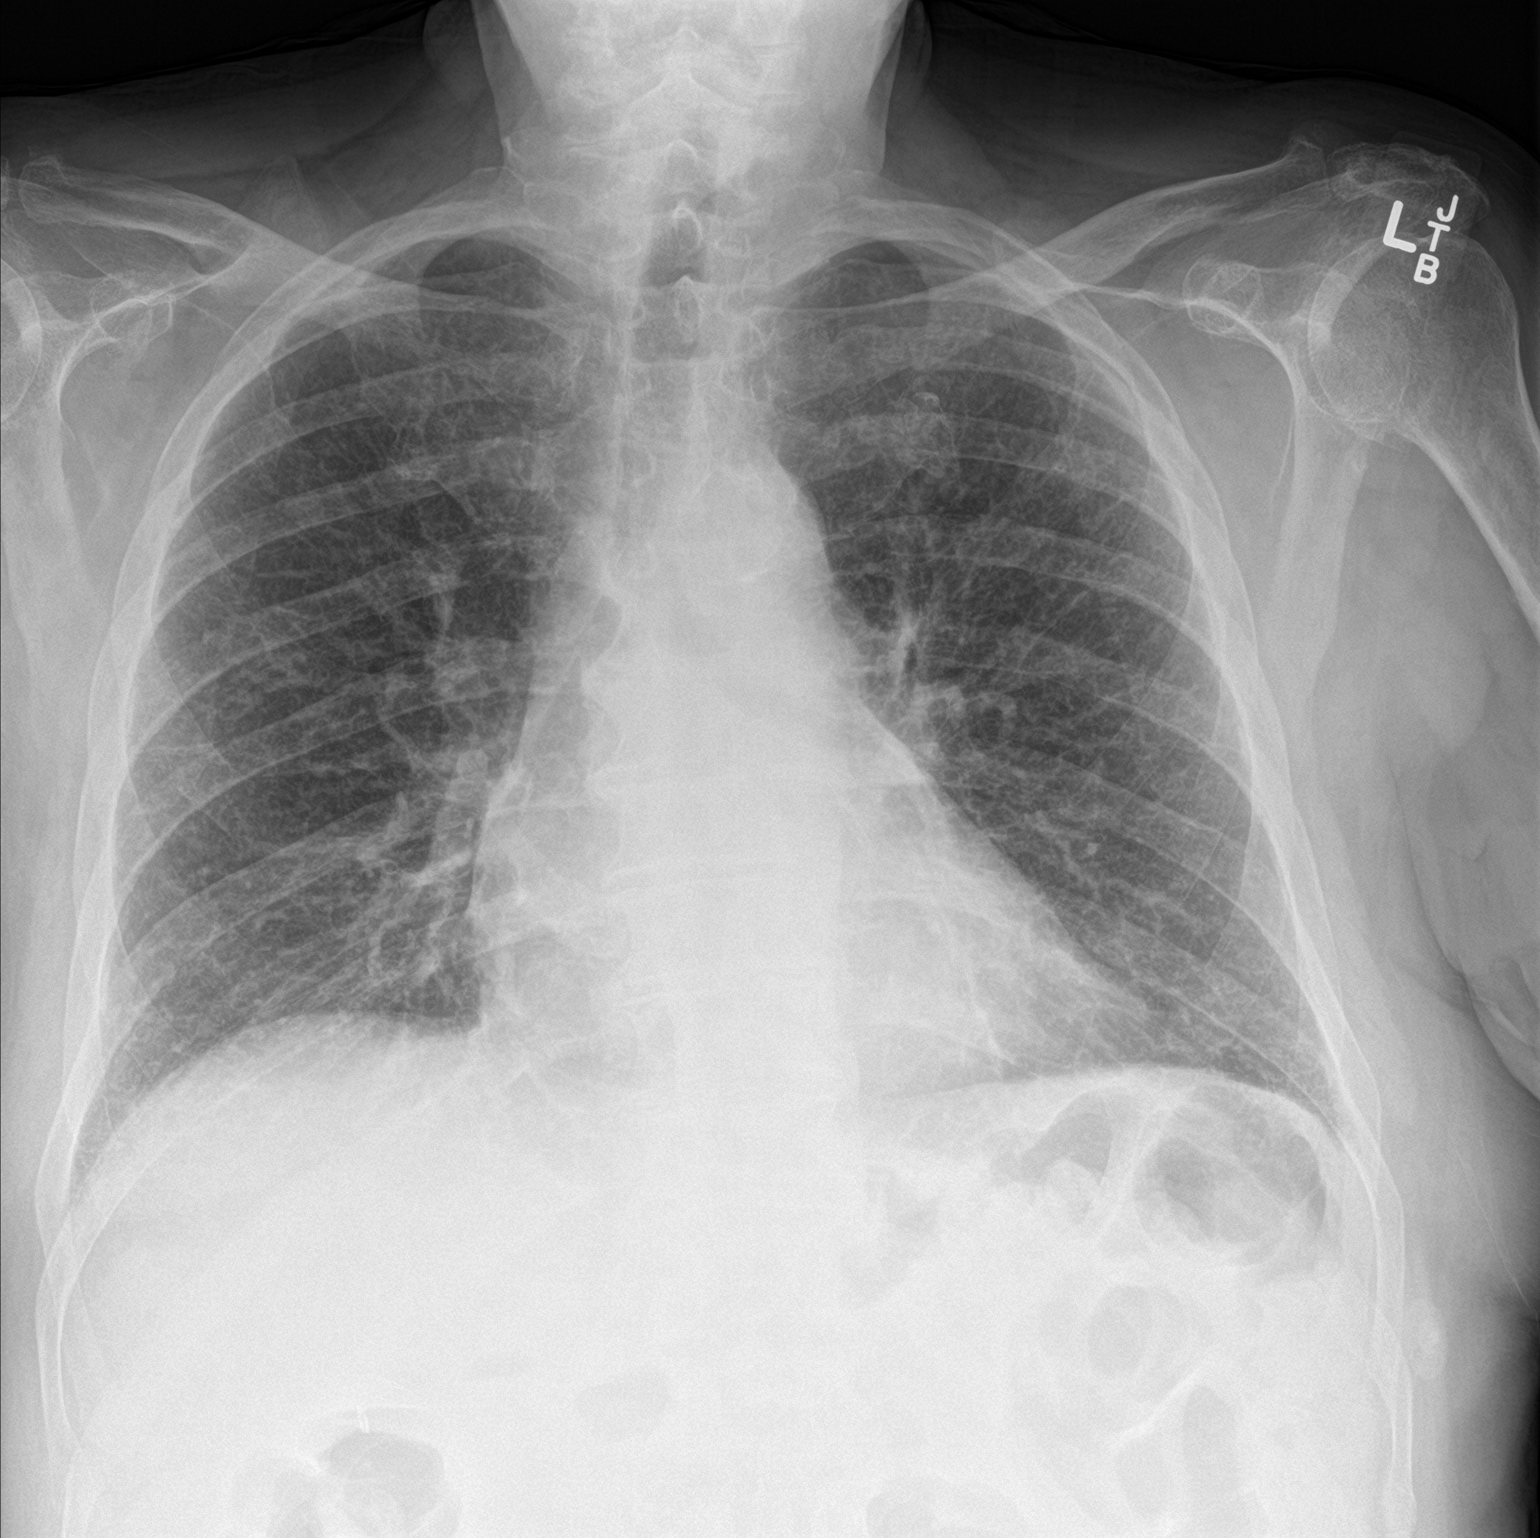

[chest lat]
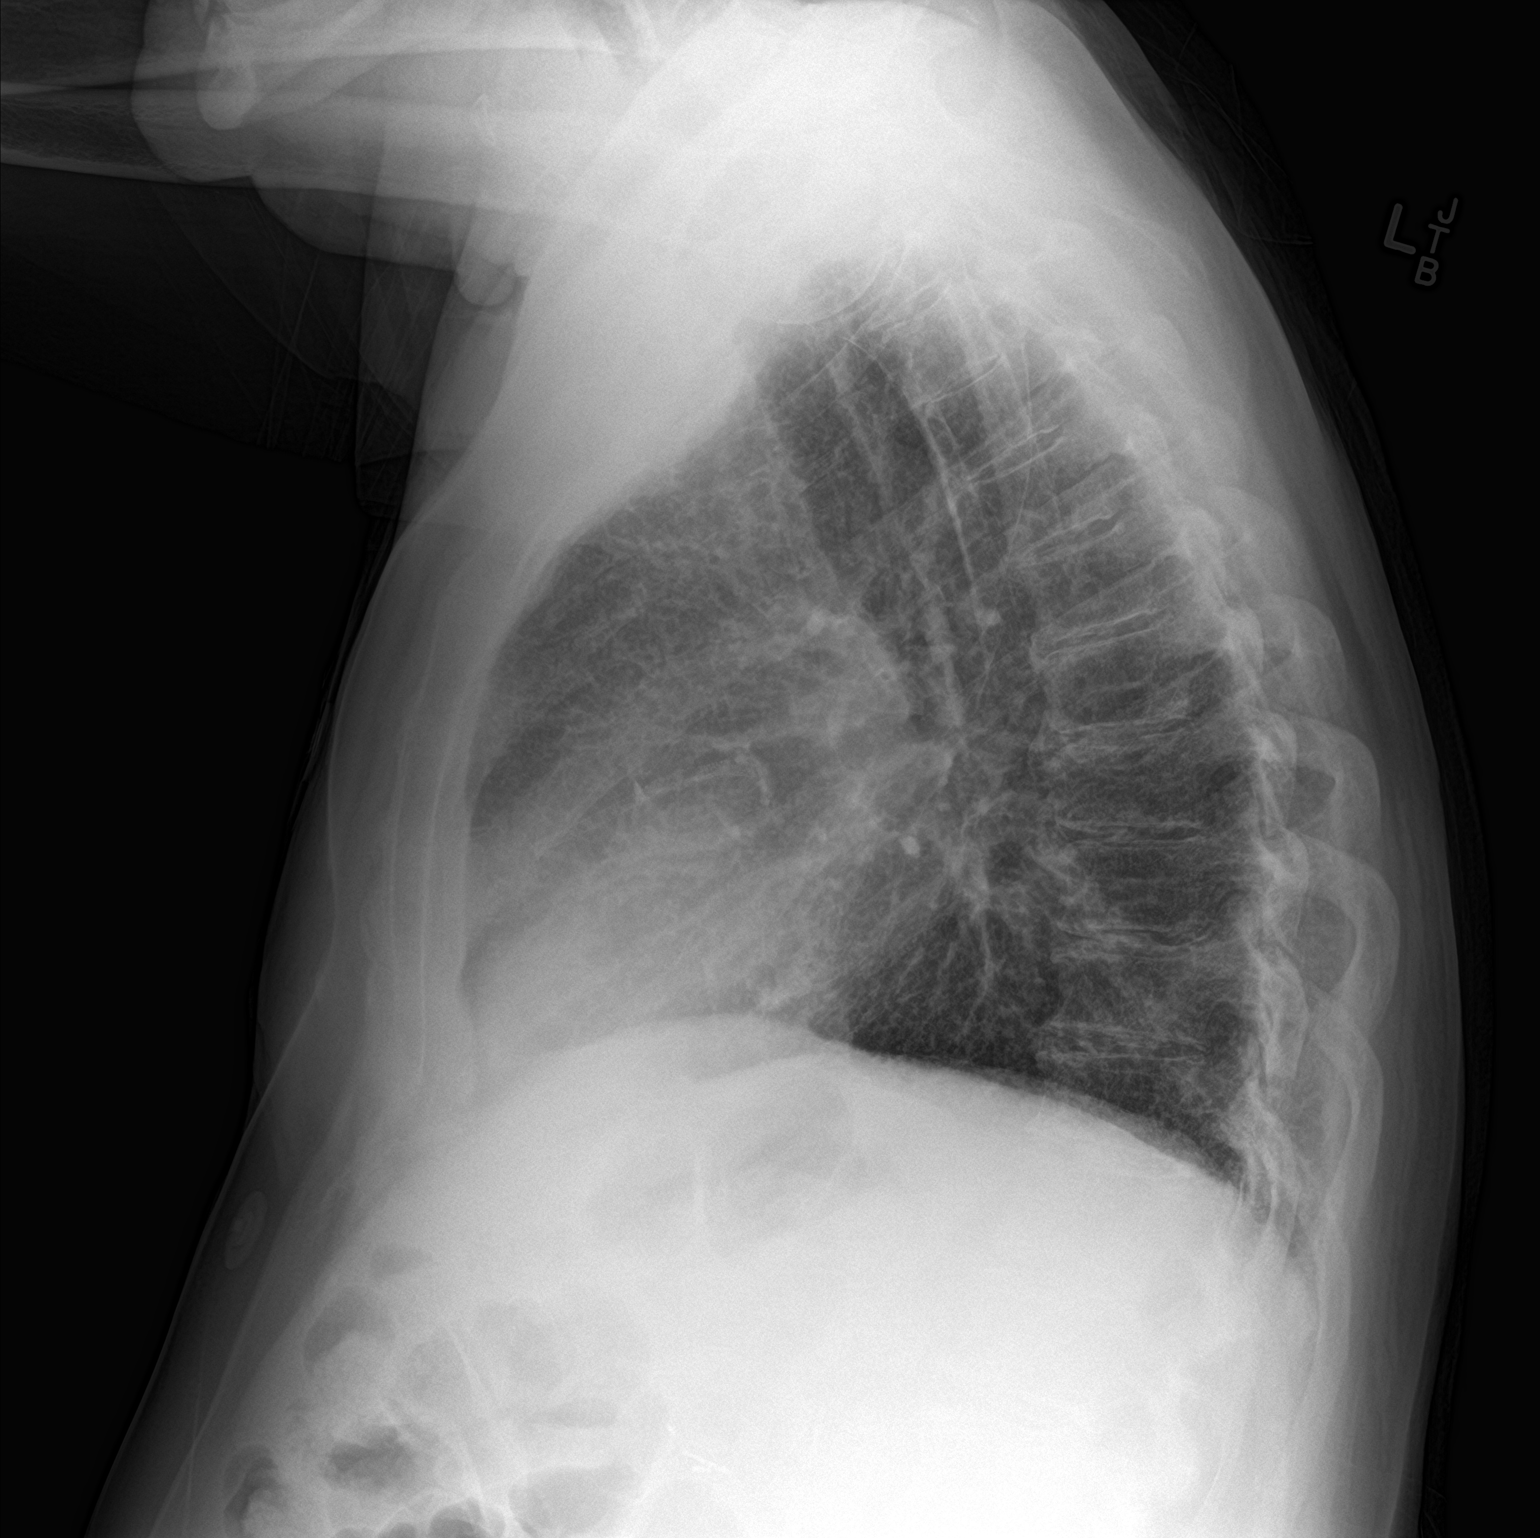

[2 of 2 positions shown; findings below may reference images not displayed]

FINDINGS: Frontal and lateral views of the chest demonstrate a stable cardiac
silhouette. There is diffuse interstitial prominence, progressive
since prior exam from 0225. No airspace disease, effusion, or
pneumothorax. No acute bony abnormalities.
IMPRESSION: 1. Diffuse interstitial prominence as above. I would favor
progressive chronic interstitial lung disease and scarring rather
than acute interstitial edema or atypical infection.
